# Patient Record
Sex: Female | Born: 1950 | Race: White | Hispanic: No | Marital: Single | Smoking: Never smoker
Health system: Southern US, Community
[De-identification: ages and names within clinical notes are randomized; demographics above are authoritative.]

## PROBLEM LIST (undated history)

## (undated) HISTORY — PX: ABDOMINAL HYSTERECTOMY: SHX81

---

## 2008-05-30 ENCOUNTER — Ambulatory Visit: Payer: Self-pay | Admitting: Family Medicine

## 2008-05-30 DIAGNOSIS — I10 Essential (primary) hypertension: Secondary | ICD-10-CM | POA: Insufficient documentation

## 2008-05-30 DIAGNOSIS — F1010 Alcohol abuse, uncomplicated: Secondary | ICD-10-CM | POA: Insufficient documentation

## 2008-05-30 DIAGNOSIS — K219 Gastro-esophageal reflux disease without esophagitis: Secondary | ICD-10-CM | POA: Insufficient documentation

## 2008-05-31 LAB — CONVERTED CEMR LAB
ALT: 13 units/L (ref 0–35)
AST: 19 units/L (ref 0–37)
Alkaline Phosphatase: 81 units/L (ref 39–117)
BUN: 17 mg/dL (ref 6–23)
CO2: 25 meq/L (ref 19–32)
Calcium: 10.1 mg/dL (ref 8.4–10.5)
Cholesterol: 212 mg/dL — ABNORMAL HIGH (ref 0–200)
Glucose, Bld: 107 mg/dL — ABNORMAL HIGH (ref 70–99)
HDL: 91 mg/dL (ref >39)
Potassium: 4.6 meq/L (ref 3.5–5.3)
Sodium: 136 meq/L (ref 135–145)
TSH: 1.598 microintl units/mL (ref 0.350–4.50)
Total Protein: 7.2 g/dL (ref 6.0–8.3)
Triglycerides: 109 mg/dL (ref <150)

## 2008-06-14 ENCOUNTER — Ambulatory Visit: Payer: Self-pay | Admitting: Family Medicine

## 2008-06-14 DIAGNOSIS — M79609 Pain in unspecified limb: Secondary | ICD-10-CM | POA: Insufficient documentation

## 2008-08-17 ENCOUNTER — Encounter: Payer: Self-pay | Admitting: Family Medicine

## 2008-08-18 LAB — CONVERTED CEMR LAB
Folate: 13.5 ng/mL
HCT: 40 % (ref 36.0–46.0)
Hemoglobin: 12.6 g/dL (ref 12.0–15.0)
MCV: 102.6 fL — ABNORMAL HIGH (ref 78.0–100.0)

## 2008-08-25 ENCOUNTER — Encounter: Admission: RE | Admit: 2008-08-25 | Discharge: 2008-08-25 | Payer: Self-pay | Admitting: Family Medicine

## 2008-08-26 DIAGNOSIS — IMO0002 Reserved for concepts with insufficient information to code with codable children: Secondary | ICD-10-CM | POA: Insufficient documentation

## 2008-08-26 DIAGNOSIS — M1710 Unilateral primary osteoarthritis, unspecified knee: Secondary | ICD-10-CM

## 2008-10-17 ENCOUNTER — Ambulatory Visit: Payer: Self-pay | Admitting: Family Medicine

## 2008-10-17 DIAGNOSIS — R079 Chest pain, unspecified: Secondary | ICD-10-CM | POA: Insufficient documentation

## 2008-10-18 ENCOUNTER — Encounter: Payer: Self-pay | Admitting: Family Medicine

## 2008-10-18 LAB — CONVERTED CEMR LAB
AST: 15 units/L (ref 0–37)
Alkaline Phosphatase: 84 units/L (ref 39–117)
Basophils Absolute: 0 10*3/uL (ref 0.0–0.1)
Calcium: 9 mg/dL (ref 8.4–10.5)
Creatinine, Ser: 0.86 mg/dL (ref 0.40–1.20)
Eosinophils Relative: 2 % (ref 0–5)
HCT: 38.2 % (ref 36.0–46.0)
Lymphocytes Relative: 31 % (ref 12–46)
Lymphs Abs: 1.3 10*3/uL (ref 0.7–4.0)
Neutro Abs: 2.5 10*3/uL (ref 1.7–7.7)
Platelets: 270 10*3/uL (ref 150–400)
Potassium: 5.1 meq/L (ref 3.5–5.3)
Total Bilirubin: 0.5 mg/dL (ref 0.3–1.2)
Total Protein: 6.9 g/dL (ref 6.0–8.3)
WBC: 4.3 10*3/uL (ref 4.0–10.5)

## 2010-10-15 ENCOUNTER — Telehealth (INDEPENDENT_AMBULATORY_CARE_PROVIDER_SITE_OTHER): Payer: Self-pay | Admitting: *Deleted

## 2010-11-08 NOTE — Progress Notes (Signed)
Summary: NEED MEDS SUPPLIES  Phone Note Call from Patient   Caller: Patient Summary of Call: PT CALLED ADVISE THAT SHE NOW HAS INSURANCE AND WOULD LIKE SUPPLIES OF NIXEUM 40 FOR SENT TO [facility] IN  AND SUPPLY OF MOLOXICAM. IF U NEED TO CALL HER # [phone] Initial call taken by: Janeal Holmes,  October 15, 2010 9:47 AM  Follow-up for Phone Call        Pt notified med sent and sent to schedule appt. Follow-up by: Kathlene November LPN,  October 16, 2010 10:48 AM    Prescriptions: LISINOPRIL-HYDROCHLOROTHIAZIDE 20-25 MG TABS (LISINOPRIL-HYDROCHLOROTHIAZIDE) Take 1 tablet by mouth once a day  #90 x 2   Entered by:   Kathlene November LPN   Authorized by:   Nani Gasser MD   Signed by:   Kathlene November LPN on 04/54/0981   Method used:   Electronically to        [facility] [phone]* (retail)       [address] [place], [place]  [address]       Ph: [phone]       Fax: [phone]   RxID:   [id number] NEXIUM 40 MG CPDR (ESOMEPRAZOLE MAGNESIUM) Take 1 tablet by mouth once a day  #90 x 2   Entered by:   Kathlene November LPN   Authorized by:   Nani Gasser MD   Signed by:   Kathlene November LPN on 40/34/7425   Method used:   Electronically to        [facility] [phone]* (retail)       [address] [place], [place]  [address]       Ph: [phone]       Fax: [phone]   RxID:   [id number]

## 2010-11-27 ENCOUNTER — Ambulatory Visit: Payer: Self-pay | Admitting: Family Medicine

## 2010-11-28 ENCOUNTER — Ambulatory Visit: Payer: Self-pay | Admitting: Family Medicine

## 2011-04-24 ENCOUNTER — Other Ambulatory Visit: Payer: Self-pay | Admitting: Family Medicine

## 2011-04-24 DIAGNOSIS — Z1231 Encounter for screening mammogram for malignant neoplasm of breast: Secondary | ICD-10-CM

## 2011-04-30 ENCOUNTER — Ambulatory Visit: Payer: Self-pay

## 2011-07-14 ENCOUNTER — Other Ambulatory Visit: Payer: Self-pay | Admitting: Family Medicine

## 2011-07-26 ENCOUNTER — Encounter: Payer: Self-pay | Admitting: Family Medicine

## 2011-07-30 ENCOUNTER — Ambulatory Visit: Payer: Self-pay | Admitting: Family Medicine

## 2011-08-08 ENCOUNTER — Telehealth: Payer: Self-pay | Admitting: Family Medicine

## 2011-08-08 NOTE — Telephone Encounter (Signed)
Please call patient. Normal mammogram.  Repeat in 1 year.  

## 2011-08-09 NOTE — Telephone Encounter (Signed)
Pt aware.

## 2011-08-28 ENCOUNTER — Encounter: Payer: Self-pay | Admitting: Family Medicine

## 2011-10-21 ENCOUNTER — Other Ambulatory Visit: Payer: Self-pay | Admitting: *Deleted

## 2011-10-21 ENCOUNTER — Ambulatory Visit: Payer: Self-pay | Admitting: Family Medicine

## 2011-10-21 MED ORDER — LISINOPRIL-HYDROCHLOROTHIAZIDE 20-25 MG PO TABS: 1.0000 | ORAL_TABLET | Freq: Every day | ORAL | Status: DC

## 2011-10-28 ENCOUNTER — Ambulatory Visit: Payer: Self-pay | Admitting: Family Medicine

## 2011-11-04 ENCOUNTER — Ambulatory Visit: Payer: Self-pay | Admitting: Family Medicine

## 2011-11-11 ENCOUNTER — Ambulatory Visit (INDEPENDENT_AMBULATORY_CARE_PROVIDER_SITE_OTHER): Payer: BC Managed Care – PPO | Admitting: Family Medicine

## 2011-11-11 ENCOUNTER — Encounter: Payer: Self-pay | Admitting: Family Medicine

## 2011-11-11 VITALS — BP 144/68 | HR 89 | Wt 172.0 lb

## 2011-11-11 DIAGNOSIS — R42 Dizziness and giddiness: Secondary | ICD-10-CM

## 2011-11-11 DIAGNOSIS — I10 Essential (primary) hypertension: Secondary | ICD-10-CM

## 2011-11-11 DIAGNOSIS — K219 Gastro-esophageal reflux disease without esophagitis: Secondary | ICD-10-CM

## 2011-11-11 MED ORDER — METOPROLOL SUCCINATE ER 25 MG PO TB24: 25.0000 mg | ORAL_TABLET | Freq: Every day | ORAL | Status: DC

## 2011-11-11 NOTE — Progress Notes (Signed)
  Subjective:    Patient ID: Alison [name], female    DOB: June 04, 1951, 61 y.o.   MRN: [id number]  HPI  She feels really lightheaded after eats, every time.  Says started a year ago.  She wonders if could be from her BP medication. No worsening or alleviating sxs. Last for a few mintues. No nausea or HA.  She has been very compliant with her meds. No CP or SOB. Says sometimes feels like she might throw up after she eats. Feels like she might gag and then will choke and then feels better. Happens only with spicey or greasy foods. Says food doesn't really get stuck.  She takes her nexium daily.  She is past due for colonoscopy but declines today.    Review of Systems     Objective:   Physical Exam  Constitutional: She is oriented to person, place, and time. She appears well-developed and well-nourished.  HENT:  Head: Normocephalic and atraumatic.  Cardiovascular: Normal rate, regular rhythm and normal heart sounds.   Pulmonary/Chest: Effort normal and breath sounds normal.  Neurological: She is alert and oriented to person, place, and time.  Skin: Skin is warm and dry.  Psychiatric: She has a normal mood and affect. Her behavior is normal.          Assessment & Plan:  HTN - not well controlled. Discussed low salt diet and regular exercise. We also will add metoprolol 20 mg to her current regimen. I discussed that her old BP med is working fine but there may be reasons we need to add to it. Reviewed low sat diet. F/u in 1 mo.   Dizzy after eating - unclear etiology. Discussed that it could be a her glucose or maybe even her blood pressure going too high. She has not been a long time so not sure how long her blood pressure has not been well controlled. We will check a TSH, CBC and CMP. She is also due for a screening lipid panel.  GERD- The choking/gaging seem to be reflux related.  Can use TUMS for break through sxs. Also reviewed foods needs to stay away from . Discussed GI but pt  declined to see them.   She declines flu and tetanus vaccines today.  She declines colonoscopy today.

## 2011-11-11 NOTE — Patient Instructions (Signed)

## 2011-12-17 ENCOUNTER — Other Ambulatory Visit: Payer: Self-pay | Admitting: Family Medicine

## 2012-02-18 ENCOUNTER — Other Ambulatory Visit: Payer: Self-pay | Admitting: Family Medicine

## 2012-06-14 ENCOUNTER — Other Ambulatory Visit: Payer: Self-pay | Admitting: Family Medicine

## 2012-06-24 ENCOUNTER — Telehealth: Payer: Self-pay | Admitting: *Deleted

## 2012-06-24 MED ORDER — MOMETASONE FUROATE 50 MCG/ACT NA SUSP: NASAL | Status: DC

## 2012-06-24 NOTE — Telephone Encounter (Signed)
I'd be happy to, if her symptoms persist after five days of using nasonex I'd encourage her to schedule a clinic visit for an evaluation.  I've sent in the Rx to her [facility] on file.

## 2012-06-24 NOTE — Telephone Encounter (Signed)
Pt has called asking if we can call her in some Nasonex. States allergies are bothering her and she has a stuffy head. States she has started taking allegra. Pt of Dr. Linford Arnold. Please advise.

## 2012-06-24 NOTE — Telephone Encounter (Signed)
Pt informed

## 2012-07-17 ENCOUNTER — Other Ambulatory Visit: Payer: Self-pay | Admitting: Family Medicine

## 2012-08-18 ENCOUNTER — Other Ambulatory Visit: Payer: Self-pay | Admitting: Family Medicine

## 2012-08-31 ENCOUNTER — Other Ambulatory Visit: Payer: Self-pay | Admitting: Family Medicine

## 2012-08-31 NOTE — Telephone Encounter (Signed)
Needs appointment

## 2012-09-14 ENCOUNTER — Other Ambulatory Visit: Payer: Self-pay | Admitting: Family Medicine

## 2012-09-14 NOTE — Telephone Encounter (Signed)
Must make appointment 

## 2012-10-16 ENCOUNTER — Other Ambulatory Visit: Payer: Self-pay | Admitting: Family Medicine

## 2012-11-09 ENCOUNTER — Ambulatory Visit: Payer: BC Managed Care – PPO | Admitting: Family Medicine

## 2012-11-09 ENCOUNTER — Other Ambulatory Visit: Payer: Self-pay | Admitting: *Deleted

## 2012-11-09 MED ORDER — METOPROLOL SUCCINATE ER 25 MG PO TB24: 25.0000 mg | ORAL_TABLET | Freq: Every day | ORAL | Status: DC

## 2012-11-10 NOTE — Telephone Encounter (Addendum)
Pt called to inform me that she had forgotten that she had to work and was asking me to call in a Rx for her to get her BP meds and asked if I could call in the prescription for a 30 day supply due to price. (While pt was talking I looked back at her visit history and pt chronically DNKA's. since 11/28/2011, 11/29/2011, 07/29/2012, 10/21/2011, 10/28/2011, 11/04/2011, and 11/09/2012 she has cancelled all of these appt. She has an appt on 11/16/2012 @ 1045.) She told me that the last time that this had to be done they called her in 20 of her pills and it cost her the same amount that it did had she gotten a 30 day supply. I informed her that I could not make any guarantees that she would receive a 30 day supply being that her appt was for her to come in today to discuss her BP. I told her that more than likely she would receive an amount that will be enough to last her until she came in for her appt. Pt was not very happy about the answer that was given and I told her that this is standard even with me or anyone else. She still continued to say how unfair it is that she would have to pay the same amount or close to it for a few pills when I should just call it in for a 30 day supply. I informed Dr. Linford Arnold of this and also informed her of all of the pt's cancelled visits she agreed with my plan to send in enough tablets until pt's appt.  I was not able to call in pt's rx after speaking with her due to pt's coming into office. Pt called back later in the day very irrational and irate saying that if she has a stroke it will be my fault and I will be sued. She asked if we/I cared about the well being of our patients. She continues on with this onslaught of words and she became louder and began to belittle me questioning my intelligence. I then told the pt that I would not continue to allow her to speak to me in this manner and if she continued speaking to me in this manner that I will hang up the phone. This did not deter  her she continued with her verbal assault towards me. I then hung up the phone. Pt called back several times she spoke with Victorino Dike to inquire whether or not her meds were sent.  I spoke with my Director Toniann Fail B. regarding this and informed her that I would write this up as a notable behavior in the pt's chart. I will route this to Dr. Linford Arnold and Gardiner Ramus, Viann Shove

## 2012-11-16 ENCOUNTER — Encounter: Payer: Self-pay | Admitting: Family Medicine

## 2012-11-16 ENCOUNTER — Ambulatory Visit (INDEPENDENT_AMBULATORY_CARE_PROVIDER_SITE_OTHER): Payer: BC Managed Care – PPO | Admitting: Family Medicine

## 2012-11-16 VITALS — BP 160/98 | HR 92 | Ht 61.0 in | Wt 181.0 lb

## 2012-11-16 DIAGNOSIS — Z23 Encounter for immunization: Secondary | ICD-10-CM

## 2012-11-16 DIAGNOSIS — I10 Essential (primary) hypertension: Secondary | ICD-10-CM

## 2012-11-16 MED ORDER — METOPROLOL SUCCINATE ER 25 MG PO TB24: 25.0000 mg | ORAL_TABLET | Freq: Every day | ORAL | Status: DC

## 2012-11-16 MED ORDER — ESOMEPRAZOLE MAGNESIUM 40 MG PO CPDR: DELAYED_RELEASE_CAPSULE | ORAL | Status: DC

## 2012-11-16 NOTE — Progress Notes (Signed)
  Subjective:    Patient ID: Alison [name], female    DOB: 11-10-1950, 62 y.o.   MRN: [id number]  HPI HTN -  Pt denies chest pain, SOB, dizziness, or heart palpitations.  Taking meds as directed w/o problems.  Denies medication side effects.  Trying to eat a low salt diet.  She was given a lab slip of this year ago and never went. She says she is willing to go now but does need to it re-printed. She was a little bit upset today. She got upset with her nurse on the phone last week and a little embarrassed and then felt very anxious about coming in today. In fact she was tearful during the visit today.    Review of Systems     Objective:   Physical Exam  Constitutional: She is oriented to person, place, and time. She appears well-developed and well-nourished.  HENT:  Head: Normocephalic and atraumatic.  Cardiovascular: Normal rate, regular rhythm and normal heart sounds.   Pulmonary/Chest: Effort normal and breath sounds normal.  Neurological: She is alert and oriented to person, place, and time.  Skin: Skin is warm and dry.  Psychiatric: She has a normal mood and affect. Her behavior is normal.          Assessment & Plan:  HTN- uncontrolled.  She was upset today and feels like that to be the cause of her elevated blood pressure. Recommend followup in one month to repeat pressure, before we adjust her medication or increase it. Continue work on low-salt diet. Also due for CMP and fasting lipid panel. Lab slip given today. Medication refill today for 2 months worth.  Discussed need for shingles vaccine. Handout given. Encouraged her to check with her insurance to see if it's covered.  Due for screening mammogram. Reminded her. I can encourage her to check with her insurance to verify coverage.

## 2012-11-16 NOTE — Patient Instructions (Addendum)
Check with insurance to see if covers a zostavax.   Blood pressure cuff brands:  Omron and lifestyle. DASH Diet The DASH diet stands for "Dietary Approaches to Stop Hypertension." It is a healthy eating plan that has been shown to reduce high blood pressure (hypertension) in as little as 14 days, while also possibly providing other significant health benefits. These other health benefits include reducing the risk of breast cancer after menopause and reducing the risk of type 2 diabetes, heart disease, colon cancer, and stroke. Health benefits also include weight loss and slowing kidney failure in patients with chronic kidney disease.  DIET GUIDELINES  Limit salt (sodium). Your diet should contain less than 1500 mg of sodium daily.  Limit refined or processed carbohydrates. Your diet should include mostly whole grains. Desserts and added sugars should be used sparingly.  Include small amounts of heart-healthy fats. These types of fats include nuts, oils, and tub margarine. Limit saturated and trans fats. These fats have been shown to be harmful in the body. CHOOSING FOODS  The following food groups are based on a 2000 calorie diet. See your Registered Dietitian for individual calorie needs. Grains and Grain Products (6 to 8 servings daily)  Eat More Often: Whole-wheat bread, brown rice, whole-grain or wheat pasta, quinoa, popcorn without added fat or salt (air popped).  Eat Less Often: White bread, white pasta, white rice, cornbread. Vegetables (4 to 5 servings daily)  Eat More Often: Fresh, frozen, and canned vegetables. Vegetables may be raw, steamed, roasted, or grilled with a minimal amount of fat.  Eat Less Often/Avoid: Creamed or fried vegetables. Vegetables in a cheese sauce. Fruit (4 to 5 servings daily)  Eat More Often: All fresh, canned (in natural juice), or frozen fruits. Dried fruits without added sugar. One hundred percent fruit juice ( cup [237 mL] daily).  Eat Less Often:  Dried fruits with added sugar. Canned fruit in light or heavy syrup. [employer], Fish, and Poultry (2 servings or less daily. One serving is 3 to 4 oz [85-114 g]).  Eat More Often: Ninety percent or leaner ground beef, tenderloin, sirloin. Round cuts of beef, chicken breast, [place] breast. All fish. Grill, bake, or broil your meat. Nothing should be fried.  Eat Less Often/Avoid: Fatty cuts of meat, [place], or chicken leg, thigh, or wing. Fried cuts of meat or fish. Dairy (2 to 3 servings)  Eat More Often: Low-fat or fat-free milk, low-fat plain or light yogurt, reduced-fat or part-skim cheese.  Eat Less Often/Avoid: Milk (whole, 2%).Whole milk yogurt. Full-fat cheeses. Nuts, Seeds, and Legumes (4 to 5 servings per week)  Eat More Often: All without added salt.  Eat Less Often/Avoid: Salted nuts and seeds, canned beans with added salt. Fats and Sweets (limited)  Eat More Often: Vegetable oils, tub margarines without trans fats, sugar-free gelatin. Mayonnaise and salad dressings.  Eat Less Often/Avoid: Coconut oils, palm oils, butter, stick margarine, cream, half and half, cookies, candy, pie. FOR MORE INFORMATION The Dash Diet Eating Plan: www.dashdiet.org Document Released: 09/12/2011 Document Revised: 12/16/2011 Document Reviewed: 09/12/2011 [facility] Patient Information 2013 [place], [place].

## 2012-12-14 ENCOUNTER — Ambulatory Visit: Payer: BC Managed Care – PPO | Admitting: Family Medicine

## 2012-12-21 ENCOUNTER — Ambulatory Visit: Payer: BC Managed Care – PPO | Admitting: Family Medicine

## 2012-12-22 ENCOUNTER — Ambulatory Visit: Payer: BC Managed Care – PPO | Admitting: Family Medicine

## 2013-02-02 LAB — LIPID PANEL
Cholesterol: 212 mg/dL — ABNORMAL HIGH (ref 0–200)
LDL Cholesterol: 111 mg/dL — ABNORMAL HIGH (ref 0–99)
Total CHOL/HDL Ratio: 2.7 Ratio

## 2013-02-02 LAB — COMPLETE METABOLIC PANEL WITH GFR
ALT: 13 U/L (ref 0–35)
AST: 14 U/L (ref 0–37)
Alkaline Phosphatase: 91 U/L (ref 39–117)
BUN: 17 mg/dL (ref 6–23)
CO2: 26 mEq/L (ref 19–32)
Creat: 0.81 mg/dL (ref 0.50–1.10)
GFR, Est African American: >89 mL/min
GFR, Est Non African American: 79 mL/min
Glucose, Bld: 105 mg/dL — ABNORMAL HIGH (ref 70–99)
Sodium: 139 mEq/L (ref 135–145)
Total Bilirubin: 0.9 mg/dL (ref 0.3–1.2)

## 2013-02-02 LAB — CBC WITH DIFFERENTIAL/PLATELET
HCT: 40.5 % (ref 36.0–46.0)
Lymphocytes Relative: 39 % (ref 12–46)
Lymphs Abs: 1.4 10*3/uL (ref 0.7–4.0)
MCH: 33.5 pg (ref 26.0–34.0)
MCV: 99 fL (ref 78.0–100.0)
Monocytes Absolute: 0.2 10*3/uL (ref 0.1–1.0)
Platelets: 221 10*3/uL (ref 150–400)
RBC: 4.09 MIL/uL (ref 3.87–5.11)
RDW: 13.2 % (ref 11.5–15.5)
WBC: 3.5 10*3/uL — ABNORMAL LOW (ref 4.0–10.5)

## 2013-02-02 LAB — TSH: TSH: 2.191 u[IU]/mL (ref 0.350–4.500)

## 2013-02-03 ENCOUNTER — Other Ambulatory Visit: Payer: Self-pay | Admitting: *Deleted

## 2013-02-03 MED ORDER — METOPROLOL SUCCINATE ER 25 MG PO TB24: 25.0000 mg | ORAL_TABLET | Freq: Every day | ORAL | Status: DC

## 2013-05-05 ENCOUNTER — Other Ambulatory Visit: Payer: Self-pay | Admitting: *Deleted

## 2013-05-05 MED ORDER — METOPROLOL SUCCINATE ER 25 MG PO TB24: 25.0000 mg | ORAL_TABLET | Freq: Every day | ORAL | Status: DC

## 2013-05-31 ENCOUNTER — Ambulatory Visit: Payer: BC Managed Care – PPO | Admitting: Family Medicine

## 2013-08-02 ENCOUNTER — Ambulatory Visit: Payer: BC Managed Care – PPO | Admitting: Family Medicine

## 2013-08-09 ENCOUNTER — Other Ambulatory Visit: Payer: Self-pay | Admitting: *Deleted

## 2013-08-09 ENCOUNTER — Telehealth: Payer: Self-pay | Admitting: *Deleted

## 2013-08-09 ENCOUNTER — Other Ambulatory Visit: Payer: Self-pay | Admitting: Family Medicine

## 2013-08-09 MED ORDER — METOPROLOL SUCCINATE ER 25 MG PO TB24: 25.0000 mg | ORAL_TABLET | Freq: Every day | ORAL | Status: DC

## 2013-08-09 NOTE — Telephone Encounter (Signed)
Pt called back after I LM stating we would only do the 2 weeks stating how the pharmacy charges more for 2 weeks than a month. I informed her how she was suppose to f/u in April and has not. She gave reasons why her BP was elevated and I informed her that insurance companies do send you questionaires and if pt have not f/u then they won't cover medical issues and it is a liability. She stated she would make her appt but she would not be referring to you like she has been. I informed her that was ok but it is a liability if we don't make them f/u.  Meyer Cory, LPN   [address]

## 2013-08-09 NOTE — Telephone Encounter (Signed)
Pt called and LM stating she needs to cancel her appt in am and wants a refill on her BP med. She spoke as if [place] had already sent over 1 refill until her appt. She has not been seen since Feb 2014. Please advise about a refill.  Meyer Cory, LPN

## 2013-08-09 NOTE — Telephone Encounter (Signed)
LMOM and informed pt I was sending 2 weeks worth and she needs to comer in before they run out.  Meyer Cory, LPN

## 2013-08-09 NOTE — Telephone Encounter (Signed)
Can have 2 weeks worth

## 2013-08-10 ENCOUNTER — Ambulatory Visit: Payer: BC Managed Care – PPO | Admitting: Family Medicine

## 2013-08-16 ENCOUNTER — Encounter: Payer: Self-pay | Admitting: Family Medicine

## 2013-08-16 ENCOUNTER — Ambulatory Visit (INDEPENDENT_AMBULATORY_CARE_PROVIDER_SITE_OTHER): Payer: BC Managed Care – PPO | Admitting: Family Medicine

## 2013-08-16 VITALS — BP 150/79 | HR 83 | Wt 174.0 lb

## 2013-08-16 DIAGNOSIS — I10 Essential (primary) hypertension: Secondary | ICD-10-CM

## 2013-08-16 MED ORDER — METOPROLOL SUCCINATE ER 50 MG PO TB24: 50.0000 mg | ORAL_TABLET | Freq: Every day | ORAL | Status: DC

## 2013-08-16 NOTE — Progress Notes (Signed)
  Subjective:    Patient ID: Genevive Bi, female    DOB: [birth year], 62 y.o.   MRN: [id number]  HPI HTN- Home BP running in the 150s. No chest pain, shortest breath, palpitations or swelling. She's tolerating her medication well. She says she's been resistant to coming in because she really doesn't like coming to the doctor's office.  She seperated from he husband in June and is doing ok. She's not taking medications or any counseling for this. She says mentally she is adjusting and feels she is doing okay overall.  Review of Systems     Objective:   Physical Exam  Constitutional: She is oriented to person, place, and time. She appears well-developed and well-nourished.  HENT:  Head: Normocephalic and atraumatic.  Neck: Neck supple. No thyromegaly present.  Cardiovascular: Normal rate, regular rhythm and normal heart sounds.   No carotid or abdominal bruits  Pulmonary/Chest: Effort normal and breath sounds normal.  Musculoskeletal: She exhibits no edema.  Neurological: She is alert and oriented to person, place, and time.  Skin: Skin is warm and dry.  Psychiatric: She has a normal mood and affect. Her behavior is normal.          Assessment & Plan:  HTN- uncontrolled on current regimen. We'll increase metoprolol to 50 mg. Offered to switch her to twice a day dosing to metoprolol tartrate for cheaper option but she declines as she goes to quit once a day. Also encouraged her to check with her insurance company or the pharmacy to see if a 90 day supply will be less expensive. Otherwise followup in 3 months to recheck blood pressure on adjusted regimen if she has any side effects or problems and was called office back and we can make an adjustment of the phone. She is also interested in potentially switching back to lisinopril instead because it was less expensive, which is perfect we find that she will need followup in 6 weeks. She declined.  Declined flu vaccine and tetanus today.  Encouraged her to think about it.

## 2013-11-16 ENCOUNTER — Other Ambulatory Visit: Payer: Self-pay | Admitting: Family Medicine

## 2013-11-22 ENCOUNTER — Ambulatory Visit: Payer: BC Managed Care – PPO | Admitting: Physician Assistant

## 2013-11-22 DIAGNOSIS — Z0289 Encounter for other administrative examinations: Secondary | ICD-10-CM

## 2013-11-26 ENCOUNTER — Encounter: Payer: Self-pay | Admitting: Physician Assistant

## 2013-11-26 ENCOUNTER — Ambulatory Visit (INDEPENDENT_AMBULATORY_CARE_PROVIDER_SITE_OTHER): Payer: BC Managed Care – PPO | Admitting: Physician Assistant

## 2013-11-26 VITALS — BP 202/85 | HR 83 | Wt 174.0 lb

## 2013-11-26 DIAGNOSIS — F1010 Alcohol abuse, uncomplicated: Secondary | ICD-10-CM

## 2013-11-26 DIAGNOSIS — F329 Major depressive disorder, single episode, unspecified: Secondary | ICD-10-CM

## 2013-11-26 DIAGNOSIS — I10 Essential (primary) hypertension: Secondary | ICD-10-CM

## 2013-11-26 DIAGNOSIS — F32A Depression, unspecified: Secondary | ICD-10-CM

## 2013-11-26 DIAGNOSIS — F3289 Other specified depressive episodes: Secondary | ICD-10-CM

## 2013-11-26 MED ORDER — METOPROLOL SUCCINATE ER 100 MG PO TB24: 100.0000 mg | ORAL_TABLET | Freq: Every day | ORAL | Status: DC

## 2013-11-26 MED ORDER — SERTRALINE HCL 50 MG PO TABS: ORAL_TABLET | ORAL | Status: DC

## 2013-11-26 MED ORDER — NALTREXONE HCL 50 MG PO TABS: 50.0000 mg | ORAL_TABLET | Freq: Every day | ORAL | Status: DC

## 2013-11-26 NOTE — Progress Notes (Signed)
   Subjective:    Patient ID: Alison [name], female    DOB: 02-04-1951, 63 y.o.   MRN: [id number]  HPI Patient is a 63 year old female who presents to the clinic with multiple concerns. She is very upset today. She is commonly seeking help for her alcoholism. Last seen her husband left her of 33 years. He left her because she was wanting him to become sober. Patient does not want to drink anymore. She finds herself drinking to treat her pain. She continues to work but when she comes home she will get completely drunk. She denies any suicidal thoughts. She's tried many times to stop drinking and lasted approximately a month. She goes to [employer] meetings and they do help. She cries all the time. She is ready to move all of her life. Patient has not had anything to drink for last 48 hours.  Hypertension-patient denies any chest pain, palpitations, vision changes, or headaches. She is taking her metoprolol as directed.     Review of Systems     Objective:   Physical Exam  Constitutional: She is oriented to person, place, and time. She appears well-developed and well-nourished.  HENT:  Head: Normocephalic and atraumatic.  Cardiovascular: Normal rate, regular rhythm and normal heart sounds.   Pulmonary/Chest: Effort normal and breath sounds normal.  Neurological: She is alert and oriented to person, place, and time.  Skin: Skin is dry.  Psychiatric:  Cried for most of the encounter.          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 I would like to start Zoloft 50 mg daily for depression. Discussed side effects of nausea, decreased libido, worsening depression. If she has any appreciable stop medication call office. Patient has trouble with on and off drinking. I would like to start Naltrexone 50 mg daily. I am also going to refer her to a psychologist to help her with counseling. I encouraged regular attendance to AA meetings and staying busy. I encouraged her to set up a  plan for cravings or being alone. If patient needs anything for sleep she was instructed to try melatonin 3mg  to 10mg  at bedtime. Follow up in 4 weeks.  Hypertension-I. suspect there is some type of correlation with depression/alcohol abuse and blood pressure. Will increase metoprolol to 100 mg daily. Will recheck in the next 2-4 weeks. Patient is to check her BP at home and call if continued to stay elevated.   Spent 30 minutes with patient and greater then 50% of visit spent counseling patient regarding alcoholism/depression.

## 2013-11-26 NOTE — Patient Instructions (Signed)
Start naltrexone daily. Zoloft take 1/2 tab for 7 days then increase to one full tablet.  Increase metoprolol 100mg  daily.   Follow up on 4 weeks.

## 2013-11-29 DIAGNOSIS — I10 Essential (primary) hypertension: Secondary | ICD-10-CM | POA: Insufficient documentation

## 2013-11-29 DIAGNOSIS — F32A Depression, unspecified: Secondary | ICD-10-CM | POA: Insufficient documentation

## 2013-11-29 DIAGNOSIS — F1010 Alcohol abuse, uncomplicated: Secondary | ICD-10-CM | POA: Insufficient documentation

## 2013-11-29 DIAGNOSIS — F329 Major depressive disorder, single episode, unspecified: Secondary | ICD-10-CM | POA: Insufficient documentation

## 2013-12-20 ENCOUNTER — Ambulatory Visit: Payer: BC Managed Care – PPO | Admitting: Physician Assistant

## 2013-12-31 ENCOUNTER — Other Ambulatory Visit: Payer: Self-pay | Admitting: Physician Assistant

## 2014-01-28 ENCOUNTER — Other Ambulatory Visit: Payer: Self-pay | Admitting: Physician Assistant

## 2014-05-18 ENCOUNTER — Other Ambulatory Visit: Payer: Self-pay | Admitting: Family Medicine

## 2014-06-22 ENCOUNTER — Other Ambulatory Visit: Payer: Self-pay | Admitting: Physician Assistant

## 2014-07-22 ENCOUNTER — Other Ambulatory Visit: Payer: Self-pay | Admitting: Physician Assistant

## 2014-08-15 ENCOUNTER — Other Ambulatory Visit: Payer: Self-pay | Admitting: Family Medicine

## 2014-08-23 ENCOUNTER — Other Ambulatory Visit: Payer: Self-pay | Admitting: *Deleted

## 2014-08-23 MED ORDER — METOPROLOL SUCCINATE ER 100 MG PO TB24: ORAL_TABLET | ORAL | Status: DC

## 2014-08-23 NOTE — Telephone Encounter (Signed)
30 day supply sent to pharmacy and follow up appt is 09/07/14. Corliss SkainsJamie Nikolos Billig, RMA

## 2014-09-06 ENCOUNTER — Ambulatory Visit: Payer: BC Managed Care – PPO | Admitting: Family Medicine

## 2014-09-12 ENCOUNTER — Ambulatory Visit (INDEPENDENT_AMBULATORY_CARE_PROVIDER_SITE_OTHER): Payer: BC Managed Care – PPO | Admitting: Family Medicine

## 2014-09-12 ENCOUNTER — Encounter: Payer: Self-pay | Admitting: Family Medicine

## 2014-09-12 VITALS — BP 154/72 | HR 65 | Temp 97.8°F | Ht 61.0 in | Wt 178.0 lb

## 2014-09-12 DIAGNOSIS — I10 Essential (primary) hypertension: Secondary | ICD-10-CM | POA: Diagnosis not present

## 2014-09-12 DIAGNOSIS — F329 Major depressive disorder, single episode, unspecified: Secondary | ICD-10-CM | POA: Diagnosis not present

## 2014-09-12 DIAGNOSIS — F32A Depression, unspecified: Secondary | ICD-10-CM

## 2014-09-12 MED ORDER — LISINOPRIL-HYDROCHLOROTHIAZIDE 20-25 MG PO TABS: 1.0000 | ORAL_TABLET | Freq: Every day | ORAL | Status: DC

## 2014-09-12 NOTE — Progress Notes (Signed)
   Subjective:    Patient ID: Genevive BiBetty A Rue, female    DOB: 1951-07-15, 63 y.o.   MRN: [id number]  HPI Hypertension- Pt denies chest pain, SOB, dizziness, or heart palpitations.  Taking meds as directed w/o problems.  Denies medication side effects.  Metoprolol was increased to 100 mg couple weeks ago. Her blood pressure systolic was 202 at that time. She plans on joining the gym in Jan.   Depression/alcohol abuse-She was seen in Feb after her husband of 33 yr left here. She decided not to take the naltrexone as took it once and it caused vomiting. She decided not to take the Zoloft. Both of these will be discontinued and taken off her medication list. She says she is no longer drinking. She went to AA for several months and has been abstinent. Unfortunately this caused her insurance to go up.     Review of Systems     Objective:   Physical Exam  Constitutional: She is oriented to person, place, and time. She appears well-developed and well-nourished.  HENT:  Head: Normocephalic and atraumatic.  Cardiovascular: Normal rate, regular rhythm and normal heart sounds.   Pulmonary/Chest: Effort normal and breath sounds normal.  Neurological: She is alert and oriented to person, place, and time.  Skin: Skin is warm and dry.  Psychiatric: She has a normal mood and affect. Her behavior is normal.          Assessment & Plan:  Hypertension-uncontrolled but improved from previous. Will add back lisinopril/HCTZ once a day.  Can start with half a day and increase to whole tab after 2 weeks.  F/U in 1 months.  We'll check a BMP at that time since we are going to start her back on an ACE inhibitor and a diuretic.  Depression/alcohol abuse - has been abstinent and doing well. Removed meds from her list.

## 2014-10-03 ENCOUNTER — Other Ambulatory Visit: Payer: Self-pay | Admitting: Family Medicine

## 2014-10-10 ENCOUNTER — Ambulatory Visit: Payer: BC Managed Care – PPO | Admitting: Family Medicine

## 2014-11-01 ENCOUNTER — Other Ambulatory Visit: Payer: Self-pay | Admitting: Family Medicine

## 2014-11-02 ENCOUNTER — Other Ambulatory Visit: Payer: Self-pay | Admitting: *Deleted

## 2014-11-02 MED ORDER — METOPROLOL SUCCINATE ER 100 MG PO TB24: ORAL_TABLET | ORAL | Status: DC

## 2014-11-02 NOTE — Telephone Encounter (Signed)
Pt was told that she must keep her appt she would not be given any more refills.Alison PacasBarkley, Alison [name]

## 2014-11-21 ENCOUNTER — Ambulatory Visit: Payer: BC Managed Care – PPO | Admitting: Family Medicine

## 2014-12-05 ENCOUNTER — Encounter: Payer: Self-pay | Admitting: Family Medicine

## 2014-12-05 ENCOUNTER — Ambulatory Visit (INDEPENDENT_AMBULATORY_CARE_PROVIDER_SITE_OTHER): Payer: Self-pay | Admitting: Family Medicine

## 2014-12-05 VITALS — BP 153/65 | HR 73 | Wt 178.0 lb

## 2014-12-05 DIAGNOSIS — F1011 Alcohol abuse, in remission: Secondary | ICD-10-CM

## 2014-12-05 DIAGNOSIS — I10 Essential (primary) hypertension: Secondary | ICD-10-CM

## 2014-12-05 DIAGNOSIS — F1010 Alcohol abuse, uncomplicated: Secondary | ICD-10-CM

## 2014-12-05 MED ORDER — METOPROLOL SUCCINATE ER 100 MG PO TB24: 100.0000 mg | ORAL_TABLET | Freq: Every day | ORAL | Status: DC

## 2014-12-05 NOTE — Progress Notes (Signed)
   Subjective:    Patient ID: Alison [name], female    DOB: 12-11-1950, 64 y.o.   MRN: [id number]  HPI Hypertension- Pt denies chest pain, SOB, dizziness, or heart palpitations.  Taking meds as directed w/o problems.  Denies medication side effects.  Has gained some weight.   Alcohol abuse - she is doing well. She is still sober. She is now seperated from her husband and done well.  She really hasn't gotten out out much   Review of Systems     Objective:   Physical Exam  Constitutional: She is oriented to person, place, and time. She appears well-developed and well-nourished.  HENT:  Head: Normocephalic and atraumatic.  Cardiovascular: Normal rate, regular rhythm and normal heart sounds.   Pulmonary/Chest: Effort normal and breath sounds normal.  Neurological: She is alert and oriented to person, place, and time.  Skin: Skin is warm and dry.  Psychiatric: She has a normal mood and affect. Her behavior is normal.          Assessment & Plan:  HTN - uncontrolled but not on the ACE. She has been forgetting her dose. Discussed strategies to help her be consitant. Will check labs as she is overdue.  F/u in 6 weeks to recheck BP.    Alcohol abuse - sober and doing well for the last years.

## 2015-01-23 ENCOUNTER — Ambulatory Visit: Payer: BLUE CROSS/BLUE SHIELD | Admitting: Family Medicine

## 2015-02-27 ENCOUNTER — Ambulatory Visit (INDEPENDENT_AMBULATORY_CARE_PROVIDER_SITE_OTHER): Payer: BLUE CROSS/BLUE SHIELD | Admitting: Family Medicine

## 2015-02-27 ENCOUNTER — Encounter: Payer: Self-pay | Admitting: Family Medicine

## 2015-02-27 VITALS — BP 168/84 | HR 75 | Wt 166.0 lb

## 2015-02-27 DIAGNOSIS — I10 Essential (primary) hypertension: Secondary | ICD-10-CM

## 2015-02-27 DIAGNOSIS — T50905A Adverse effect of unspecified drugs, medicaments and biological substances, initial encounter: Secondary | ICD-10-CM

## 2015-02-27 NOTE — Progress Notes (Signed)
   Subjective:    Patient ID: Alison [name], female    DOB: February 02, 1951, 64 y.o.   MRN: [id number]  HPI Here for hypertension follow-up today. When I last saw her in February she had been forgetting her medication frequently. She says she total sample HCTZ for 3 weeks and it made her sick and dizzy. She stopped it on her own.  She is still taking her metoprolol.  Has lost 12 lbs.  Has been taking her BP around 3PM for the last 5 days and says her BP running int the 130s. She feels extremely tense when she comes in here. She really feels like she may have white coat hypertension.    Review of Systems     Objective:   Physical Exam  Constitutional: She is oriented to person, place, and time. She appears well-developed and well-nourished.  HENT:  Head: Normocephalic and atraumatic.  Cardiovascular: Normal rate, regular rhythm and normal heart sounds.   Pulmonary/Chest: Effort normal and breath sounds normal.  Neurological: She is alert and oriented to person, place, and time.  Skin: Skin is warm and dry.  Psychiatric: She has a normal mood and affect. Her behavior is normal.          Assessment & Plan:  Hypertension-uncontrolled. Congratulated her on weight loss.  She's lost 12 pounds which is absolutely fantastic. Encouraged her to continue to work on this. The blood pressures that she checked at [facility] in the last week all look great except for one which was 140. She certainly may have an element of whitecoat hypertension. I encouraged her check her blood pressure at home over the next 2 weeks at least once a day. Bring in the log with her and we will check her cuff against ours here in the office in about 2 weeks. If blood pressures are still elevated at home that we may need to consider adding something like lisinopril but much lower dose around 10 mg without the diuretic and see if she tolerates that better. Since, the lisinopril HCT 20/25 caused her to feel dizzy and  lightheaded.  Medication side effect-see note above.

## 2015-03-02 ENCOUNTER — Other Ambulatory Visit: Payer: Self-pay | Admitting: Family Medicine

## 2015-05-29 ENCOUNTER — Other Ambulatory Visit: Payer: Self-pay | Admitting: Family Medicine

## 2015-08-28 ENCOUNTER — Other Ambulatory Visit: Payer: Self-pay | Admitting: Family Medicine

## 2015-11-22 ENCOUNTER — Telehealth: Payer: Self-pay | Admitting: Family Medicine

## 2015-11-22 ENCOUNTER — Other Ambulatory Visit: Payer: Self-pay | Admitting: Family Medicine

## 2015-11-22 NOTE — Telephone Encounter (Signed)
I called pt and she states that she doesn't have any days off and she is dealing with her father dying. She will have to call back to schedule appt time. I let her talk to a nurse about her med refills

## 2015-12-06 MED ORDER — METOPROLOL SUCCINATE ER 100 MG PO TB24: 100.0000 mg | ORAL_TABLET | Freq: Every day | ORAL | Status: DC

## 2015-12-06 NOTE — Addendum Note (Signed)
Addended by: Deno Etienne on: 12/06/2015 03:37 PM   Modules accepted: Orders

## 2015-12-06 NOTE — Telephone Encounter (Signed)
1 refill given.Alison [name]

## 2016-01-23 ENCOUNTER — Other Ambulatory Visit: Payer: Self-pay | Admitting: Family Medicine

## 2016-01-31 ENCOUNTER — Ambulatory Visit (INDEPENDENT_AMBULATORY_CARE_PROVIDER_SITE_OTHER): Payer: BLUE CROSS/BLUE SHIELD | Admitting: Family Medicine

## 2016-01-31 VITALS — BP 167/63 | HR 63 | Wt 175.0 lb

## 2016-01-31 DIAGNOSIS — R51 Headache: Secondary | ICD-10-CM | POA: Diagnosis not present

## 2016-01-31 DIAGNOSIS — I10 Essential (primary) hypertension: Secondary | ICD-10-CM

## 2016-01-31 DIAGNOSIS — S40022A Contusion of left upper arm, initial encounter: Secondary | ICD-10-CM

## 2016-01-31 DIAGNOSIS — R42 Dizziness and giddiness: Secondary | ICD-10-CM | POA: Diagnosis not present

## 2016-01-31 DIAGNOSIS — R519 Headache, unspecified: Secondary | ICD-10-CM

## 2016-01-31 LAB — COMPLETE METABOLIC PANEL WITH GFR
ALBUMIN: 3.6 g/dL (ref 3.6–5.1)
ALK PHOS: 76 U/L (ref 33–130)
ALT: 10 U/L (ref 6–29)
AST: 15 U/L (ref 10–35)
BILIRUBIN TOTAL: 0.8 mg/dL (ref 0.2–1.2)
BUN: 14 mg/dL (ref 7–25)
CALCIUM: 8.9 mg/dL (ref 8.6–10.4)
CO2: 26 mmol/L (ref 20–31)
CREATININE: 0.76 mg/dL (ref 0.50–0.99)
Chloride: 103 mmol/L (ref 98–110)
GFR, Est African American: >89 mL/min (ref >=60)
GFR, Est Non African American: 83 mL/min (ref >=60)
GLUCOSE: 120 mg/dL — AB (ref 65–99)
POTASSIUM: 4.7 mmol/L (ref 3.5–5.3)
SODIUM: 139 mmol/L (ref 135–146)
TOTAL PROTEIN: 6.2 g/dL (ref 6.1–8.1)

## 2016-01-31 LAB — LIPID PANEL
CHOL/HDL RATIO: 1.9 ratio (ref <=5.0)
CHOLESTEROL: 217 mg/dL — AB (ref 125–200)
HDL: 116 mg/dL (ref >=46)
LDL Cholesterol: 88 mg/dL (ref <130)
Triglycerides: 63 mg/dL (ref <150)
VLDL: 13 mg/dL (ref <30)

## 2016-01-31 LAB — TSH: TSH: 2.98 m[IU]/L

## 2016-01-31 MED ORDER — METOPROLOL SUCCINATE ER 100 MG PO TB24: 100.0000 mg | ORAL_TABLET | Freq: Every day | ORAL | Status: DC

## 2016-01-31 MED ORDER — LOSARTAN POTASSIUM-HCTZ 100-25 MG PO TABS: 0.5000 | ORAL_TABLET | Freq: Every day | ORAL | Status: DC

## 2016-01-31 NOTE — Patient Instructions (Signed)
Start with half a tab the losartan HCT. And then when I see you back in 2 weeks we can see if you're ready to go to a whole tab or not.

## 2016-01-31 NOTE — Progress Notes (Signed)
Subjective:    Patient ID: Alison [name], female    DOB: [birth year], 65 y.o.   MRN: [id number]  HPI Hypertension- Pt denies chest pain, SOB, dizziness, or heart palpitations.  Taking meds as directed w/o problems.  Denies medication side effects.    Says that she fell Sunday at church and hit her right upper arm and is quite bruised. She said she just scuffed her foot on the gym floor and tripped. She did not feel lightheaded or dizzy at that time. They checked her blood pressure at church after she fell and her systolic was over 200.   She reports that she has felt lightheaded and dizzyOn on and off. This was unrelated to the fall at church.. She says been going on for months and seems to come and goes. It seems to get a little worse when she starts to worry. Her father did pass away recently. She says sometimes she'll get headaches on the top of her head as well. She is asymptomatic today.     Review of Systems  BP 167/63 mmHg  Pulse 63  Wt 175 lb (79.379 kg)  SpO2 99%    No Known Allergies  Past Medical History  Diagnosis Date  . Endometriosis     Past Surgical History  Procedure Laterality Date  . Abdominal hysterectomy      Social History   Social History  . Marital Status: Married    Spouse Name: N/A  . Number of Children: N/A  . Years of Education: N/A   Occupational History  . Not on file.   Social History Main Topics  . Smoking status: Never Smoker   . Smokeless tobacco: Not on file  . Alcohol Use: Yes  . Drug Use: No  . Sexual Activity: Not on file     Comment: works FT, doesn't regularly exercise   Other Topics Concern  . Not on file   Social History Narrative    Family History  Problem Relation Age of Onset  . Cancer Mother   . Hyperlipidemia Mother   . Hypertension Mother   . Alcohol abuse Father   . Cancer    . Heart attack    . Diabetes    . Stroke      Outpatient Encounter Prescriptions as of 01/31/2016  Medication Sig  .  losartan-hydrochlorothiazide (HYZAAR) 100-25 MG tablet Take 0.5-1 tablets by mouth daily.  . metoprolol succinate (TOPROL-XL) 100 MG 24 hr tablet Take 1 tablet (100 mg total) by mouth daily.  . [DISCONTINUED] metoprolol succinate (TOPROL-XL) 100 MG 24 hr tablet Take 1 tablet (100 mg total) by mouth daily. *ADDITIONAL REFILL REQUIRE AN OFFICE VISIT. PLEASE CALL THE OFFICE TO SCHEDULE*   No facility-administered encounter medications on file as of 01/31/2016.          Objective:   Physical Exam  Constitutional: She is oriented to person, place, and time. She appears well-developed and well-nourished.  HENT:  Head: Normocephalic and atraumatic.  Neck: Neck supple. No thyromegaly present.  Cardiovascular: Normal rate, regular rhythm and normal heart sounds.   Pulmonary/Chest: Effort normal and breath sounds normal.  Musculoskeletal: She exhibits no edema.  Lymphadenopathy:    She has no cervical adenopathy.  Neurological: She is alert and oriented to person, place, and time.  Skin: Skin is warm and dry.  He does have a very large bruise over the left upper arm extending over the left elbow. Normal range of motion of the elbow and shoulder.  Psychiatric: She has a normal mood and affect. Her behavior is normal.       Assessment & Plan:  HTN - Uncontrolled. We'll continue metoprolol and add losartan HCTZ to her regimen. Follow-up in 2 weeks. Start with half a tab of the losartan as she is worried about her sensitivity to medication. Due for CMP and lipid panel today. Will also check TSH. Check BMP at follow-up in 2 weeks after starting losartan HCT.  Contusion of left upper arm-appears to be healing well.  Headaches/dizziness-suspect related to her high blood pressure. F/U in 2 weeks. Will also do labs just rule out electrolyte disturbance. We'll also check her thyroid. If not improving then consider further workup with CBC.

## 2016-02-19 ENCOUNTER — Ambulatory Visit: Payer: BLUE CROSS/BLUE SHIELD | Admitting: Family Medicine

## 2016-03-06 ENCOUNTER — Ambulatory Visit (INDEPENDENT_AMBULATORY_CARE_PROVIDER_SITE_OTHER): Payer: BLUE CROSS/BLUE SHIELD | Admitting: Family Medicine

## 2016-03-06 ENCOUNTER — Encounter: Payer: Self-pay | Admitting: Family Medicine

## 2016-03-06 VITALS — BP 122/68 | HR 68 | Wt 173.0 lb

## 2016-03-06 DIAGNOSIS — F1010 Alcohol abuse, uncomplicated: Secondary | ICD-10-CM | POA: Diagnosis not present

## 2016-03-06 DIAGNOSIS — Z1231 Encounter for screening mammogram for malignant neoplasm of breast: Secondary | ICD-10-CM

## 2016-03-06 DIAGNOSIS — Z6832 Body mass index (BMI) 32.0-32.9, adult: Secondary | ICD-10-CM

## 2016-03-06 DIAGNOSIS — I10 Essential (primary) hypertension: Secondary | ICD-10-CM

## 2016-03-06 MED ORDER — LOSARTAN POTASSIUM-HCTZ 100-25 MG PO TABS: 1.0000 | ORAL_TABLET | Freq: Every day | ORAL | Status: DC

## 2016-03-06 NOTE — Progress Notes (Signed)
Subjective:    CC: HTN   HPI:  Hypertension- Pt denies chest pain, SOB, dizziness, or heart palpitations.  Taking meds as directed w/o problems.  Denies medication side effects.  She try going up to 2 tabs on the losartan and says it gave her horrible headache for about a week so she went back down to one half tab and has been tolerating that well. She's on the metoprolol 100 mg daily. She's decided she really wants to commit to losing weight to better control her blood pressure.  Alcohol abuse - Still sober and not drinking.   She admits she hasn't really been exercising due to her knee pain. She was told years ago that she really needed knee replacements. She has gained some weight over the last year or so and does know that it is affecting her blood pressure.    Past medical history, Surgical history, Family history not pertinant except as noted below, Social history, Allergies, and medications have been entered into the medical record, reviewed, and corrections made.   Review of Systems: No fevers, chills, night sweats, weight loss, chest pain, or shortness of breath.   Objective:    General: Well Developed, well nourished, and in no acute distress.  Neuro: Alert and oriented x3, extra-ocular muscles intact, sensation grossly intact.  HEENT: Normocephalic, atraumatic  Skin: Warm and dry, no rashes. Cardiac: Regular rate and rhythm, no murmurs rubs or gallops, no lower extremity edema.  Respiratory: Clear to auscultation bilaterally. Not using accessory muscles, speaking in full sentences.   Impression and Recommendations:   HTN- Continue current regimen with blood pressure medication for now and will continue work on diet and exercise.she is actually taking 1.5 tabs of the losartan.    Alcohol abuse -  still not drinking which is absolutely fantastic.  Obesity/BMI of 32-we had a long discussion today about strategies and getting back on track with diet and exercise. Because of her  knee problems I do think her exercises can be very limited but she can certainly work on chair stretches and exercises and really focusing on her diet. I think if she can lose 15-20 pounds would make a huge impact on her blood pressure.  Due for screening mammogram order placed.

## 2016-03-18 ENCOUNTER — Ambulatory Visit (HOSPITAL_BASED_OUTPATIENT_CLINIC_OR_DEPARTMENT_OTHER): Payer: BLUE CROSS/BLUE SHIELD

## 2016-04-25 ENCOUNTER — Other Ambulatory Visit: Payer: Self-pay | Admitting: Family Medicine

## 2016-07-29 ENCOUNTER — Other Ambulatory Visit: Payer: Self-pay | Admitting: Family Medicine

## 2016-07-29 DIAGNOSIS — K529 Noninfective gastroenteritis and colitis, unspecified: Secondary | ICD-10-CM | POA: Insufficient documentation

## 2016-07-29 DIAGNOSIS — E669 Obesity, unspecified: Secondary | ICD-10-CM | POA: Insufficient documentation

## 2016-07-29 DIAGNOSIS — I951 Orthostatic hypotension: Secondary | ICD-10-CM | POA: Insufficient documentation

## 2016-08-16 ENCOUNTER — Telehealth: Payer: Self-pay

## 2016-08-16 NOTE — Telephone Encounter (Signed)
Pt is have arthritis in knee. She was seen the ER an they suggest that she stop taking tylenol and aleve. She would take up to 6 of either med daily. She also tried ibuprofen but that doesn't work for her. I advised pt to come in to see you or Dr. Karie Schwalbe. She declined that offer due to her work schedule. Do you have any suggestions of what pt can take OTC. Please advise.

## 2016-08-16 NOTE — Telephone Encounter (Signed)
Patient was seen for acute renal failure and liver injury. There is not a safe oral medicine for pain over the counter at this time for her.  She really needs to be seen both by either Dr T or myself and by her PCP.  In the interim if she cannot be seen she can use over the counter aspercream.

## 2016-08-16 NOTE — Telephone Encounter (Signed)
Pt notified of recommendations.  I strongly advised her to make appointment while she was on the phone she declined and stated that she will call back

## 2016-08-23 ENCOUNTER — Other Ambulatory Visit: Payer: Self-pay | Admitting: Family Medicine

## 2016-09-09 ENCOUNTER — Encounter: Payer: Self-pay | Admitting: Family Medicine

## 2016-09-09 ENCOUNTER — Telehealth: Payer: Self-pay | Admitting: Sports Medicine

## 2016-09-09 ENCOUNTER — Ambulatory Visit (INDEPENDENT_AMBULATORY_CARE_PROVIDER_SITE_OTHER): Payer: BLUE CROSS/BLUE SHIELD

## 2016-09-09 ENCOUNTER — Telehealth: Payer: Self-pay | Admitting: *Deleted

## 2016-09-09 ENCOUNTER — Ambulatory Visit (INDEPENDENT_AMBULATORY_CARE_PROVIDER_SITE_OTHER): Payer: BLUE CROSS/BLUE SHIELD | Admitting: Family Medicine

## 2016-09-09 VITALS — BP 124/78 | HR 72 | Ht 61.0 in | Wt 181.0 lb

## 2016-09-09 DIAGNOSIS — R7309 Other abnormal glucose: Secondary | ICD-10-CM | POA: Diagnosis not present

## 2016-09-09 DIAGNOSIS — R112 Nausea with vomiting, unspecified: Secondary | ICD-10-CM

## 2016-09-09 DIAGNOSIS — M1711 Unilateral primary osteoarthritis, right knee: Secondary | ICD-10-CM | POA: Insufficient documentation

## 2016-09-09 DIAGNOSIS — R748 Abnormal levels of other serum enzymes: Secondary | ICD-10-CM

## 2016-09-09 DIAGNOSIS — R7301 Impaired fasting glucose: Secondary | ICD-10-CM | POA: Diagnosis not present

## 2016-09-09 DIAGNOSIS — I10 Essential (primary) hypertension: Secondary | ICD-10-CM | POA: Diagnosis not present

## 2016-09-09 DIAGNOSIS — M170 Bilateral primary osteoarthritis of knee: Secondary | ICD-10-CM

## 2016-09-09 LAB — POCT GLYCOSYLATED HEMOGLOBIN (HGB A1C): Hemoglobin A1C: 5.9

## 2016-09-09 MED ORDER — METOPROLOL SUCCINATE ER 100 MG PO TB24: ORAL_TABLET | ORAL | 1 refills | Status: DC

## 2016-09-09 MED ORDER — DICLOFENAC SODIUM 1 % TD GEL: 2.0000 g | Freq: Four times a day (QID) | TRANSDERMAL | 5 refills | Status: DC

## 2016-09-09 MED ORDER — LOSARTAN POTASSIUM-HCTZ 100-12.5 MG PO TABS: 1.0000 | ORAL_TABLET | Freq: Every day | ORAL | 1 refills | Status: DC

## 2016-09-09 NOTE — Progress Notes (Signed)
   Subjective:    I'm seeing this patient as a consultation for:  Dr. Nani Gasseratherine Metheney  CC: Bilateral knee pain  HPI: This is a pleasant 65 year old female, she's had years of bilateral knee pain, injections in the past with good results, pain is recurrent, moderate, persistent, localized at the joint lines. No radiation, no mechanical symptoms.  Past medical history:  Negative.  See flowsheet/record as well for more information.  Surgical history: Negative.  See flowsheet/record as well for more information.  Family history: Negative.  See flowsheet/record as well for more information.  Social history: Negative.  See flowsheet/record as well for more information.  Allergies, and medications have been entered into the medical record, reviewed, and no changes needed.   Review of Systems: No headache, visual changes, nausea, vomiting, diarrhea, constipation, dizziness, abdominal pain, skin rash, fevers, chills, night sweats, weight loss, swollen lymph nodes, body aches, joint swelling, muscle aches, chest pain, shortness of breath, mood changes, visual or auditory hallucinations.   Objective:   General: Well Developed, well nourished, and in no acute distress.  Neuro/Psych: Alert and oriented x3, extra-ocular muscles intact, able to move all 4 extremities, sensation grossly intact. Skin: Warm and dry, no rashes noted.  Respiratory: Not using accessory muscles, speaking in full sentences, trachea midline.  Cardiovascular: Pulses palpable, no extremity edema. Abdomen: Does not appear distended. Bilateral knees: Normal to inspection with no erythema or effusion or obvious bony abnormalities. Palpation normal with no warmth or joint line tenderness or patellar tenderness or condyle tenderness. ROM normal in flexion and extension and lower leg rotation. Ligaments with solid consistent endpoints including ACL, PCL, LCL, MCL. Negative Mcmurray's and provocative meniscal tests. Non painful  patellar compression. Patellar and quadriceps tendons unremarkable. Hamstring and quadriceps strength is normal.  Procedure: Real-time Ultrasound Guided Injection of left knee Device: GE Logiq E  Verbal informed consent obtained.  Time-out conducted.  Noted no overlying erythema, induration, or other signs of local infection.  Skin prepped in a sterile fashion.  Local anesthesia: Topical Ethyl chloride.  With sterile technique and under real time ultrasound guidance:  1 mL kenalog 40, 2 mL lidocaine, 2 mL Marcaine injected easily Completed without difficulty  Pain immediately resolved suggesting accurate placement of the medication.  Advised to call if fevers/chills, erythema, induration, drainage, or persistent bleeding.  Images permanently stored and available for review in the ultrasound unit.  Impression: Technically successful ultrasound guided injection.  Procedure: Real-time Ultrasound Guided Injection of right knee Device: GE Logiq E  Verbal informed consent obtained.  Time-out conducted.  Noted no overlying erythema, induration, or other signs of local infection.  Skin prepped in a sterile fashion.  Local anesthesia: Topical Ethyl chloride.  With sterile technique and under real time ultrasound guidance:  1 mL kenalog 40, 2 mL lidocaine, 2 mL Marcaine injected easily Completed without difficulty  Pain immediately resolved suggesting accurate placement of the medication.  Advised to call if fevers/chills, erythema, induration, drainage, or persistent bleeding.  Images permanently stored and available for review in the ultrasound unit.  Impression: Technically successful ultrasound guided injection.  Impression and Recommendations:   This case required medical decision making of moderate complexity.  Primary osteoarthritis of both knees Bilateral injection, ordering updated x-rays, I'm also going to get her set up for viscous supplementation.

## 2016-09-09 NOTE — Assessment & Plan Note (Signed)
Bilateral injection, ordering updated x-rays, I'm also going to get her set up for viscous supplementation.

## 2016-09-09 NOTE — Telephone Encounter (Signed)
Prior auth submitted and approved for Diclofenac gel. No home # listed. [id number] - PA Case ID: [id number].called and left message on pharmacy vm

## 2016-09-09 NOTE — Telephone Encounter (Signed)
-----   Message from Monica Bectonhomas J Thekkekandam, MD sent at 09/09/2016  9:52 AM EST ----- Bilateral OrthoVisc approval please. _________________________________________ Ihor Austinhomas J. Benjamin Stainhekkekandam, M.D., ABFM., CAQSM. Primary Care and Sports Medicine [facility]  Adjunct Instructor of Family Medicine  [employer]

## 2016-09-09 NOTE — Addendum Note (Signed)
Addended by: Deno EtienneBARKLEY, Kersti Scavone L on: 09/09/2016 09:48 AM   Modules accepted: Orders

## 2016-09-09 NOTE — Addendum Note (Signed)
Addended by: Monica BectonHEKKEKANDAM, THOMAS J on: 09/09/2016 09:52 AM   Modules accepted: Orders

## 2016-09-09 NOTE — Progress Notes (Signed)
Subjective:    CC: HTN  HPI:  Hypertension- Pt denies chest pain, SOB, dizziness, or heart palpitations.  Taking meds as directed w/o problems.  Denies medication side effects.    Patient was also recently seen in the emergency department, October 23, for nausea and vomiting. She had an acute kidney injury and was dehydrated at that time. They were able to rehydrate her and get her renal function back down to 0.92. They actually recommended that she also follow-up with GI since she had elevated liver enzymes. Since then she's been feeling much better.  She is most concerned with her bilateral knee pain. Now that she's had the back of her Aleve after she had the acute kidney injury she really isn't left with much else to take. She says Tylenol does not work for her and with her recent elevation in liver enzymes she's been advised to avoid Tylenol as well. They recommended that she follow with me to discuss treatment options. She said she was told years ago that she really needed bilateral knee replacements. She has undergone injections and Visco supplementation years ago. Nothing recently.  Past medical history, Surgical history, Family history not pertinant except as noted below, Social history, Allergies, and medications have been entered into the medical record, reviewed, and corrections made.   Review of Systems: No fevers, chills, night sweats, weight loss, chest pain, or shortness of breath.   Objective:    General: Well Developed, well nourished, and in no acute distress.  Neuro: Alert and oriented x3, extra-ocular muscles intact, sensation grossly intact.  HEENT: Normocephalic, atraumatic  Skin: Warm and dry, no rashes. Cardiac: Regular rate and rhythm, no murmurs rubs or gallops, no lower extremity edema.  Respiratory: Clear to auscultation bilaterally. Not using accessory muscles, speaking in full sentences.   Impression and Recommendations:   HTN - Well controlled. Continue  current regimen. Follow up in  6 months.    N/V - Resolved.  She has also dec her Aleve.    Abnormal glucose - check A1C.  5.9. Consistent with impaired fasting glucose. Discussed dietary changes. Recommend recheck in 6 months.  Elevated liver enzymes - she says they will recheck her at the end of the month. They think it may be coming from her gallbladder and gallstones. In fact she has a consultation with the surgeon later this month for removal.  Bilateral knee asked arthritis-we'll try Voltaren gel. We'll have her see one of our sports medicine doctors for bilateral knee injections will see if this will help get her through the holidays.

## 2016-09-09 NOTE — Telephone Encounter (Signed)
Submitted for approval on Orthovisc. Awaiting confirmation.  

## 2016-09-09 NOTE — Patient Instructions (Signed)
Please consider getting your mammogram this year.

## 2016-09-10 NOTE — Telephone Encounter (Signed)
Received the following information from OV benefits investigation:  As of December 1st, [employer] will no longer consider HA products medically necessary. All claims submitted 09/06/2016 will be denied. [id number] is also not covered. REF: [id number]: [id number]   Spoke with Pt, will try and resubmit after 1/18 and see if benefits will change.

## 2016-09-10 NOTE — Telephone Encounter (Signed)
Patient may change insurance, she will contact office if she does.

## 2016-09-26 ENCOUNTER — Telehealth: Payer: Self-pay

## 2016-09-26 MED ORDER — BENZONATATE 200 MG PO CAPS: 200.0000 mg | ORAL_CAPSULE | Freq: Two times a day (BID) | ORAL | 0 refills | Status: DC | PRN

## 2016-09-26 NOTE — Telephone Encounter (Signed)
Left VM for Pt to return clinic call, callback information provided. 

## 2016-09-26 NOTE — Telephone Encounter (Signed)
[facility]. She could also consider trying Delsym over-the-counter as well she has not tried any over-the-counter medication. With her prior history of alcohol use I did not want to prescribe anything that has a narcotic for her. If she's not feeling better after the holidays and encourage her to make an appointment.  Nani Gasseratherine Metheney, MD

## 2016-09-26 NOTE — Telephone Encounter (Signed)
Alison RhodesBetty reports a dry cough for 1.5 weeks. Denies nausea, vomiting, fever, chills, sweats, nasal congestion or drainage in throat. I tried to schedule her for an acute visit and she stated she doesn't have the time. She would like something for her cough. Please advise.

## 2016-10-15 NOTE — Telephone Encounter (Signed)
Submitted for approval on Orthovisc. Awaiting confirmation.  

## 2016-10-18 ENCOUNTER — Telehealth: Payer: Self-pay | Admitting: Family Medicine

## 2016-10-18 NOTE — Telephone Encounter (Signed)
Pt just applied for medicaid. Will wait for new insurance card.

## 2016-10-18 NOTE — Telephone Encounter (Signed)
Pt recently changed insurance to Medicare and will start using mail order for her prescriptions. Pt states if she switches to pantoprazole 40mg  her rx will be free rather than paying for nexium. They will fax us an order form for completion once all details are finalized.

## 2016-10-21 ENCOUNTER — Other Ambulatory Visit: Payer: Self-pay

## 2016-10-21 MED ORDER — LOSARTAN POTASSIUM-HCTZ 100-12.5 MG PO TABS: 1.0000 | ORAL_TABLET | Freq: Every day | ORAL | 1 refills | Status: DC

## 2016-10-21 MED ORDER — METOPROLOL SUCCINATE ER 100 MG PO TB24: ORAL_TABLET | ORAL | 1 refills | Status: DC

## 2016-10-29 NOTE — Telephone Encounter (Signed)
Submitted for approval on Orthovisc with new insurance. Awaiting confirmation.

## 2016-11-01 NOTE — Telephone Encounter (Signed)
Received the following information from OV benefits investigation:   Patient has a [employer] Medicare Complete Plan I HMO Plan with an effective date of 10/07/2016. [id number] is covered at 80% & [id number] is covered at 100% of the contracted rate when performed in an office setting. $50.00 copay for specialist office visit applies. If out of pocket is met, coverage goes to 100% & copay will no longer apply. Reference: [id number]  Spoke with Pt about estimated OOP. She would like to proceed with OV injections. First appt made for next week.

## 2016-11-05 ENCOUNTER — Ambulatory Visit (INDEPENDENT_AMBULATORY_CARE_PROVIDER_SITE_OTHER): Payer: Medicare Other | Admitting: Sports Medicine

## 2016-11-05 DIAGNOSIS — M170 Bilateral primary osteoarthritis of knee: Secondary | ICD-10-CM

## 2016-11-05 NOTE — Assessment & Plan Note (Signed)
Orthovisc injection #1 of 4 into both knees, return in one week for #2 

## 2016-11-05 NOTE — Progress Notes (Signed)

## 2016-11-07 ENCOUNTER — Telehealth: Payer: Self-pay | Admitting: Family Medicine

## 2016-11-07 NOTE — Telephone Encounter (Signed)
Pt needs to schedule Welcome to Medicare appt with Albin Fellingarla. I was not able to leave a VM with number provided, mn

## 2016-11-11 ENCOUNTER — Encounter: Payer: Self-pay | Admitting: Sports Medicine

## 2016-11-11 ENCOUNTER — Ambulatory Visit (INDEPENDENT_AMBULATORY_CARE_PROVIDER_SITE_OTHER): Payer: Medicare Other | Admitting: Sports Medicine

## 2016-11-11 DIAGNOSIS — M170 Bilateral primary osteoarthritis of knee: Secondary | ICD-10-CM | POA: Diagnosis not present

## 2016-11-11 NOTE — Assessment & Plan Note (Signed)
Orthovisc injection #2 of 4 into both knees, starting to feel a bit better. Declines Valium for preprocedural anxiolysis.

## 2016-11-11 NOTE — Progress Notes (Signed)

## 2016-11-18 ENCOUNTER — Encounter: Payer: Self-pay | Admitting: Sports Medicine

## 2016-11-18 ENCOUNTER — Other Ambulatory Visit: Payer: Self-pay | Admitting: Sports Medicine

## 2016-11-18 ENCOUNTER — Ambulatory Visit (INDEPENDENT_AMBULATORY_CARE_PROVIDER_SITE_OTHER): Payer: Medicare Other | Admitting: Sports Medicine

## 2016-11-18 DIAGNOSIS — M170 Bilateral primary osteoarthritis of knee: Secondary | ICD-10-CM | POA: Diagnosis not present

## 2016-11-18 MED ORDER — PANTOPRAZOLE SODIUM 40 MG PO TBEC: 40.0000 mg | DELAYED_RELEASE_TABLET | Freq: Every day | ORAL | 3 refills | Status: DC

## 2016-11-18 NOTE — Assessment & Plan Note (Signed)
Orthovisc injection #3 into both knees, return in one week for #4 

## 2016-11-18 NOTE — Progress Notes (Signed)

## 2016-11-25 ENCOUNTER — Ambulatory Visit (INDEPENDENT_AMBULATORY_CARE_PROVIDER_SITE_OTHER): Payer: Medicare Other | Admitting: Sports Medicine

## 2016-11-25 ENCOUNTER — Encounter: Payer: Self-pay | Admitting: Sports Medicine

## 2016-11-25 DIAGNOSIS — M170 Bilateral primary osteoarthritis of knee: Secondary | ICD-10-CM | POA: Diagnosis not present

## 2016-11-25 NOTE — Progress Notes (Signed)

## 2016-11-25 NOTE — Addendum Note (Signed)
Addended by: Baird KayUGLAS, Nyasha Rahilly M on: 11/25/2016 02:13 PM   Modules accepted: Orders

## 2016-11-25 NOTE — Assessment & Plan Note (Signed)
Orthovisc injection #4 into both knees. Return as needed.

## 2017-01-21 DIAGNOSIS — K8020 Calculus of gallbladder without cholecystitis without obstruction: Secondary | ICD-10-CM | POA: Diagnosis not present

## 2017-01-28 DIAGNOSIS — K760 Fatty (change of) liver, not elsewhere classified: Secondary | ICD-10-CM | POA: Diagnosis not present

## 2017-01-28 DIAGNOSIS — M1990 Unspecified osteoarthritis, unspecified site: Secondary | ICD-10-CM | POA: Diagnosis not present

## 2017-01-28 DIAGNOSIS — Z791 Long term (current) use of non-steroidal anti-inflammatories (NSAID): Secondary | ICD-10-CM | POA: Diagnosis not present

## 2017-01-28 DIAGNOSIS — K811 Chronic cholecystitis: Secondary | ICD-10-CM | POA: Diagnosis not present

## 2017-01-28 DIAGNOSIS — K219 Gastro-esophageal reflux disease without esophagitis: Secondary | ICD-10-CM | POA: Diagnosis not present

## 2017-01-28 DIAGNOSIS — M170 Bilateral primary osteoarthritis of knee: Secondary | ICD-10-CM | POA: Diagnosis not present

## 2017-01-28 DIAGNOSIS — I10 Essential (primary) hypertension: Secondary | ICD-10-CM | POA: Diagnosis not present

## 2017-01-28 DIAGNOSIS — Z8249 Family history of ischemic heart disease and other diseases of the circulatory system: Secondary | ICD-10-CM | POA: Diagnosis not present

## 2017-01-28 DIAGNOSIS — K8020 Calculus of gallbladder without cholecystitis without obstruction: Secondary | ICD-10-CM | POA: Diagnosis not present

## 2017-01-28 DIAGNOSIS — Z79899 Other long term (current) drug therapy: Secondary | ICD-10-CM | POA: Diagnosis not present

## 2017-02-10 DIAGNOSIS — K5710 Diverticulosis of small intestine without perforation or abscess without bleeding: Secondary | ICD-10-CM | POA: Diagnosis not present

## 2017-02-10 DIAGNOSIS — Z9049 Acquired absence of other specified parts of digestive tract: Secondary | ICD-10-CM | POA: Diagnosis not present

## 2017-02-10 DIAGNOSIS — K265 Chronic or unspecified duodenal ulcer with perforation: Secondary | ICD-10-CM | POA: Diagnosis not present

## 2017-02-10 DIAGNOSIS — R609 Edema, unspecified: Secondary | ICD-10-CM | POA: Diagnosis not present

## 2017-02-10 DIAGNOSIS — K8050 Calculus of bile duct without cholangitis or cholecystitis without obstruction: Secondary | ICD-10-CM | POA: Diagnosis not present

## 2017-02-10 DIAGNOSIS — K219 Gastro-esophageal reflux disease without esophagitis: Secondary | ICD-10-CM | POA: Diagnosis not present

## 2017-02-10 DIAGNOSIS — R188 Other ascites: Secondary | ICD-10-CM | POA: Diagnosis not present

## 2017-02-10 DIAGNOSIS — K5790 Diverticulosis of intestine, part unspecified, without perforation or abscess without bleeding: Secondary | ICD-10-CM | POA: Diagnosis not present

## 2017-02-10 DIAGNOSIS — T814XXA Infection following a procedure, initial encounter: Secondary | ICD-10-CM | POA: Diagnosis not present

## 2017-02-10 DIAGNOSIS — R109 Unspecified abdominal pain: Secondary | ICD-10-CM | POA: Diagnosis not present

## 2017-02-10 DIAGNOSIS — B999 Unspecified infectious disease: Secondary | ICD-10-CM | POA: Diagnosis not present

## 2017-02-10 DIAGNOSIS — N179 Acute kidney failure, unspecified: Secondary | ICD-10-CM | POA: Diagnosis not present

## 2017-02-10 DIAGNOSIS — R110 Nausea: Secondary | ICD-10-CM | POA: Diagnosis not present

## 2017-02-10 DIAGNOSIS — R748 Abnormal levels of other serum enzymes: Secondary | ICD-10-CM | POA: Diagnosis not present

## 2017-02-10 DIAGNOSIS — K750 Abscess of liver: Secondary | ICD-10-CM | POA: Diagnosis not present

## 2017-02-10 DIAGNOSIS — I959 Hypotension, unspecified: Secondary | ICD-10-CM | POA: Diagnosis not present

## 2017-02-10 DIAGNOSIS — R933 Abnormal findings on diagnostic imaging of other parts of digestive tract: Secondary | ICD-10-CM | POA: Diagnosis not present

## 2017-02-10 DIAGNOSIS — R932 Abnormal findings on diagnostic imaging of liver and biliary tract: Secondary | ICD-10-CM | POA: Diagnosis not present

## 2017-02-10 DIAGNOSIS — K859 Acute pancreatitis without necrosis or infection, unspecified: Secondary | ICD-10-CM | POA: Diagnosis not present

## 2017-02-10 DIAGNOSIS — K651 Peritoneal abscess: Secondary | ICD-10-CM | POA: Diagnosis not present

## 2017-02-10 DIAGNOSIS — K838 Other specified diseases of biliary tract: Secondary | ICD-10-CM | POA: Diagnosis not present

## 2017-02-10 DIAGNOSIS — R1010 Upper abdominal pain, unspecified: Secondary | ICD-10-CM | POA: Diagnosis not present

## 2017-02-10 DIAGNOSIS — E860 Dehydration: Secondary | ICD-10-CM | POA: Diagnosis not present

## 2017-02-20 DIAGNOSIS — K265 Chronic or unspecified duodenal ulcer with perforation: Secondary | ICD-10-CM | POA: Insufficient documentation

## 2017-03-10 ENCOUNTER — Other Ambulatory Visit: Payer: Self-pay | Admitting: Family Medicine

## 2017-04-12 DIAGNOSIS — R748 Abnormal levels of other serum enzymes: Secondary | ICD-10-CM | POA: Diagnosis not present

## 2017-04-12 DIAGNOSIS — Z79899 Other long term (current) drug therapy: Secondary | ICD-10-CM | POA: Diagnosis not present

## 2017-04-12 DIAGNOSIS — R109 Unspecified abdominal pain: Secondary | ICD-10-CM | POA: Diagnosis not present

## 2017-04-12 DIAGNOSIS — K219 Gastro-esophageal reflux disease without esophagitis: Secondary | ICD-10-CM | POA: Diagnosis not present

## 2017-04-12 DIAGNOSIS — R1011 Right upper quadrant pain: Secondary | ICD-10-CM | POA: Diagnosis not present

## 2017-04-12 DIAGNOSIS — R1012 Left upper quadrant pain: Secondary | ICD-10-CM | POA: Diagnosis not present

## 2017-04-12 DIAGNOSIS — R945 Abnormal results of liver function studies: Secondary | ICD-10-CM | POA: Diagnosis not present

## 2017-04-12 DIAGNOSIS — K831 Obstruction of bile duct: Secondary | ICD-10-CM | POA: Diagnosis not present

## 2017-04-12 DIAGNOSIS — R7989 Other specified abnormal findings of blood chemistry: Secondary | ICD-10-CM | POA: Diagnosis not present

## 2017-04-12 DIAGNOSIS — R933 Abnormal findings on diagnostic imaging of other parts of digestive tract: Secondary | ICD-10-CM | POA: Diagnosis not present

## 2017-04-12 DIAGNOSIS — K8050 Calculus of bile duct without cholangitis or cholecystitis without obstruction: Secondary | ICD-10-CM | POA: Diagnosis not present

## 2017-04-12 DIAGNOSIS — K830 Cholangitis: Secondary | ICD-10-CM | POA: Diagnosis not present

## 2017-04-12 DIAGNOSIS — Z9049 Acquired absence of other specified parts of digestive tract: Secondary | ICD-10-CM | POA: Diagnosis not present

## 2017-04-12 DIAGNOSIS — K838 Other specified diseases of biliary tract: Secondary | ICD-10-CM | POA: Diagnosis not present

## 2017-04-12 DIAGNOSIS — K839 Disease of biliary tract, unspecified: Secondary | ICD-10-CM | POA: Diagnosis not present

## 2017-04-12 DIAGNOSIS — K5710 Diverticulosis of small intestine without perforation or abscess without bleeding: Secondary | ICD-10-CM | POA: Diagnosis not present

## 2017-04-12 DIAGNOSIS — R7881 Bacteremia: Secondary | ICD-10-CM | POA: Diagnosis not present

## 2017-04-12 DIAGNOSIS — R1013 Epigastric pain: Secondary | ICD-10-CM | POA: Diagnosis not present

## 2017-04-12 DIAGNOSIS — R1010 Upper abdominal pain, unspecified: Secondary | ICD-10-CM | POA: Diagnosis not present

## 2017-04-12 DIAGNOSIS — I10 Essential (primary) hypertension: Secondary | ICD-10-CM | POA: Diagnosis not present

## 2017-04-21 DIAGNOSIS — R740 Nonspecific elevation of levels of transaminase and lactic acid dehydrogenase [LDH]: Secondary | ICD-10-CM | POA: Diagnosis not present

## 2017-04-21 DIAGNOSIS — K8050 Calculus of bile duct without cholangitis or cholecystitis without obstruction: Secondary | ICD-10-CM | POA: Diagnosis not present

## 2017-04-22 DIAGNOSIS — R945 Abnormal results of liver function studies: Secondary | ICD-10-CM

## 2017-04-22 DIAGNOSIS — R7989 Other specified abnormal findings of blood chemistry: Secondary | ICD-10-CM | POA: Insufficient documentation

## 2017-04-23 ENCOUNTER — Telehealth: Payer: Self-pay | Admitting: Family Medicine

## 2017-04-23 ENCOUNTER — Inpatient Hospital Stay: Payer: Medicare Other | Admitting: Family Medicine

## 2017-04-23 DIAGNOSIS — Z0189 Encounter for other specified special examinations: Secondary | ICD-10-CM

## 2017-04-23 NOTE — Telephone Encounter (Signed)
I called patient about her missed appointment and she stated she did not know she had an appointment with you. She said the hospital must have made the appointment and didn't communicate it with her because she wouldn't have missed it if she had known. I asked her if everything was ok and she said yes that she followed up with the [facility]. I asked if she wanted to go ahead and reschedule and she said she didn't see a need because they solved the problem when she was in the hospital. Thanks

## 2017-04-23 NOTE — Telephone Encounter (Signed)
Please call patient: I saw that she missed her appointment for hospital follow-up today. If swelling to check on her and make sure that she was okay, and get her rescheduled as soon as possible.

## 2017-05-09 ENCOUNTER — Encounter: Payer: Self-pay | Admitting: Sports Medicine

## 2017-05-09 ENCOUNTER — Ambulatory Visit (INDEPENDENT_AMBULATORY_CARE_PROVIDER_SITE_OTHER): Payer: Medicare Other | Admitting: Sports Medicine

## 2017-05-09 DIAGNOSIS — M170 Bilateral primary osteoarthritis of knee: Secondary | ICD-10-CM | POA: Diagnosis not present

## 2017-05-09 MED ORDER — CELECOXIB 100 MG PO CAPS: ORAL_CAPSULE | ORAL | 3 refills | Status: DC

## 2017-05-09 NOTE — Progress Notes (Signed)
  Subjective:    CC: Bilateral knee pain  HPI: Alison [name] is a pleasant 66 year old female with knee osteoarthritis, we have done steroid injections as well as Orthovisc injections back in February, she had only about a week of relief, never came back. She's now noted increase in pain, moderate, persistent. Localizes at the joint lines with gelling, no mechanical symptoms. She does desire repeat interventional treatment today.  Past medical history:  Negative.  See flowsheet/record as well for more information.  Surgical history: Negative.  See flowsheet/record as well for more information.  Family history: Negative.  See flowsheet/record as well for more information.  Social history: Negative.  See flowsheet/record as well for more information.  Allergies, and medications have been entered into the medical record, reviewed, and no changes needed.   Review of Systems: No fevers, chills, night sweats, weight loss, chest pain, or shortness of breath.   Objective:    General: Well Developed, well nourished, and in no acute distress.  Neuro: Alert and oriented x3, extra-ocular muscles intact, sensation grossly intact.  HEENT: Normocephalic, atraumatic, pupils equal round reactive to light, neck supple, no masses, no lymphadenopathy, thyroid nonpalpable.  Skin: Warm and dry, no rashes. Cardiac: Regular rate and rhythm, no murmurs rubs or gallops, no lower extremity edema.  Respiratory: Clear to auscultation bilaterally. Not using accessory muscles, speaking in full sentences. Bilateral knees: Minimally swollen, tenderness at the medial joint lines. ROM normal in flexion and extension and lower leg rotation. Ligaments with solid consistent endpoints including ACL, PCL, LCL, MCL. Negative Mcmurray's and provocative meniscal tests. Non painful patellar compression. Patellar and quadriceps tendons unremarkable. Hamstring and quadriceps strength is normal.  Procedure: Real-time Ultrasound Guided  Injection of left knee Device: GE Logiq E  Verbal informed consent obtained.  Time-out conducted.  Noted no overlying erythema, induration, or other signs of local infection.  Skin prepped in a sterile fashion.  Local anesthesia: Topical Ethyl chloride.  With sterile technique and under real time ultrasound guidance:  1 mL kenalog 40, 2 mL lidocaine, 2 mL bupivacaine injected easily Completed without difficulty  Pain immediately resolved suggesting accurate placement of the medication.  Advised to call if fevers/chills, erythema, induration, drainage, or persistent bleeding.  Images permanently stored and available for review in the ultrasound unit.  Impression: Technically successful ultrasound guided injection.  Procedure: Real-time Ultrasound Guided Injection of right knee Device: GE Logiq E  Verbal informed consent obtained.  Time-out conducted.  Noted no overlying erythema, induration, or other signs of local infection.  Skin prepped in a sterile fashion.  Local anesthesia: Topical Ethyl chloride.  With sterile technique and under real time ultrasound guidance:  1 mL kenalog 40, 2 mL lidocaine, 2 mL bupivacaine injected easily Completed without difficulty  Pain immediately resolved suggesting accurate placement of the medication.  Advised to call if fevers/chills, erythema, induration, drainage, or persistent bleeding.  Images permanently stored and available for review in the ultrasound unit.  Impression: Technically successful ultrasound guided injection.  Impression and Recommendations:    Primary osteoarthritis of both knees Didn't really get much relief from Viscosupplementation. Steroid injections today, referral to orthopedic surgery for consideration of bilateral arthroplasty. She does desire to schedule surgery for sometime early next year. Does have a history of peptic ulcer disease, she will continue Nexium and start low-dose Celebrex. I've also given her some  Pennsaid samples. Caution advised and warning signs discussed. Return for custom orthotics.

## 2017-05-09 NOTE — Assessment & Plan Note (Signed)
Didn't really get much relief from Viscosupplementation. Steroid injections today, referral to orthopedic surgery for consideration of bilateral arthroplasty. She does desire to schedule surgery for sometime early next year. Does have a history of peptic ulcer disease, she will continue Nexium and start low-dose Celebrex. I've also given her some Pennsaid samples. Caution advised and warning signs discussed. Return for custom orthotics.

## 2017-05-20 DIAGNOSIS — K219 Gastro-esophageal reflux disease without esophagitis: Secondary | ICD-10-CM | POA: Diagnosis not present

## 2017-05-20 DIAGNOSIS — Z466 Encounter for fitting and adjustment of urinary device: Secondary | ICD-10-CM | POA: Diagnosis not present

## 2017-05-20 DIAGNOSIS — M1990 Unspecified osteoarthritis, unspecified site: Secondary | ICD-10-CM | POA: Diagnosis not present

## 2017-05-20 DIAGNOSIS — K8050 Calculus of bile duct without cholangitis or cholecystitis without obstruction: Secondary | ICD-10-CM | POA: Diagnosis not present

## 2017-05-20 DIAGNOSIS — I10 Essential (primary) hypertension: Secondary | ICD-10-CM | POA: Diagnosis not present

## 2017-05-20 DIAGNOSIS — K5710 Diverticulosis of small intestine without perforation or abscess without bleeding: Secondary | ICD-10-CM | POA: Diagnosis not present

## 2017-05-20 DIAGNOSIS — R7989 Other specified abnormal findings of blood chemistry: Secondary | ICD-10-CM | POA: Diagnosis not present

## 2017-05-20 DIAGNOSIS — Z48815 Encounter for surgical aftercare following surgery on the digestive system: Secondary | ICD-10-CM | POA: Diagnosis not present

## 2017-05-26 ENCOUNTER — Other Ambulatory Visit: Payer: Self-pay

## 2017-05-26 MED ORDER — METOPROLOL SUCCINATE ER 100 MG PO TB24: 100.0000 mg | ORAL_TABLET | Freq: Every day | ORAL | 0 refills | Status: DC

## 2017-06-02 ENCOUNTER — Encounter: Payer: Medicare Other | Admitting: Sports Medicine

## 2017-06-03 ENCOUNTER — Ambulatory Visit (INDEPENDENT_AMBULATORY_CARE_PROVIDER_SITE_OTHER): Payer: Medicare Other | Admitting: Family Medicine

## 2017-06-03 ENCOUNTER — Encounter: Payer: Self-pay | Admitting: Family Medicine

## 2017-06-03 VITALS — BP 142/84 | HR 75 | Wt 169.0 lb

## 2017-06-03 DIAGNOSIS — R7301 Impaired fasting glucose: Secondary | ICD-10-CM

## 2017-06-03 DIAGNOSIS — F439 Reaction to severe stress, unspecified: Secondary | ICD-10-CM | POA: Diagnosis not present

## 2017-06-03 DIAGNOSIS — K219 Gastro-esophageal reflux disease without esophagitis: Secondary | ICD-10-CM

## 2017-06-03 DIAGNOSIS — I10 Essential (primary) hypertension: Secondary | ICD-10-CM

## 2017-06-03 LAB — POCT GLYCOSYLATED HEMOGLOBIN (HGB A1C): Hemoglobin A1C: 5.8

## 2017-06-03 MED ORDER — LOSARTAN POTASSIUM-HCTZ 100-12.5 MG PO TABS: 1.0000 | ORAL_TABLET | Freq: Every day | ORAL | 3 refills | Status: DC

## 2017-06-03 NOTE — Progress Notes (Signed)
Subjective:    CC: BP, glucose  HPI:  Hypertension- Pt denies chest pain, SOB, dizziness, or heart palpitations.  Taking meds as directed w/o problems.  Denies medication side effects.    Impaired fasting glucose-no increased thirst or urination. No symptoms consistent with hypoglycemia.  GERD - currently on Nexium 20 mg daily.   Note hospital d/c  From 04/21/18: "The patient is a 66 year old female has a history of hypertension, GERD, arthritis that was admitted to the hospital from 04/12/2017 and discharged on 04/16/2017. She initially came in with abdominal pain over the last 4 weeks. She had a prolonged hospital stay back in May for laparoscopic cholecystectomy, located by fluid collection in the gallbladder fossa with a small volume pneumoperitoneum possibly secondary to postoperative abscess versus duodenal perforation. A percutaneous drain was placed in early May and cultures were positive for gram-negative bacteria. She was conservatively managed and placed on twice a day PPI. Incidentally at the time she was also noted to have a mildly dilated common bile duct of 1.2 cm with a filling defect suggestive of choledocholithiasis. ERCP was deferred at that time secondary to the history of questionable duodenal perforation. Unfortunately after discharge the patient has been having abdominal pain in her epigastrium and had lost about 20 pounds. In the emergency room she was found to have a CT scan with bile duct 7 mm without evidence of choledocholithiasis. Her labs return with alkaline phosphatase 959, bilirubin 3.9, and AST ALT greater than 500. She ended up having an EGD/EUS on April 14, 2017 revealing biliary sludge and a normal EGD. She had a small periampullary diverticulum and an 8 mm lipoma in the area distal to the ampulla. She did have an ERCP on 04/14/2017 revealing biliary debris status post sphincterotomy with a plastic stent placed. Her blood cultures came back positive for Klebsiella pneumonia  susceptible to Zosyn and Levaquin. Her diet was advanced her discharge labs included alkaline phosphatase 329, bilirubin 0.5, ALT 73, AST 18. She was feeling better and was recommended to follow up with [place] in the outpatient setting and consider repeat ERCP in 4 weeks for stent removal. She was to continue and complete a 14 day course of Levaquin which was discussed with infectious disease. Currently the patient reports She is doing very well. She denies any hematochezia, melena, hematemesis, nausea vomiting, abdominal pain. She denies any heartburn, reflux, dysphagia. Her most recent labs were from April 16, 2017 hemoglobin was 9.3, ALT was 73 AST 18, alkaline phosphatase was still elevated at 329 and bilirubin was normal"  She did have the stent removed on 05/20/2017. A biliary sphincterotomy was performed. They ruled out any strictures or narrowing.  She is just feeling a little stressed and overwhelmed over all. 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 And is just trying to catch up.   Past medical history, Surgical history, Family history not pertinant except as noted below, Social history, Allergies, and medications have been entered into the medical record, reviewed, and corrections made.   Review of Systems: No fevers, chills, night sweats, weight loss, chest pain, or shortness of breath.   Objective:    General: Well Developed, well nourished, and in no acute distress.  Neuro: Alert and oriented x3, extra-ocular muscles intact, sensation grossly intact.  HEENT: Normocephalic, atraumatic  Skin: Warm and dry, no rashes. Cardiac:  Regular rate and rhythm, no murmurs rubs or gallops, no lower extremity edema.  Respiratory: Clear to auscultation bilaterally. Not using accessory muscles, speaking in full sentences.   Impression and Recommendations:    HTN - BP borderline  today but she is tearful and anxious.    IFG - stable.  A1C of 5.8.   Continue current regimen we'll follow every 6 months. Continue work on [employer] and food choices  GERD- stable. Well-controlled. Her abdominal symptoms have improved significantly since having the stent removed.  STress - denies depressive symptoms but has just felt a little overwhelmed. She has her face and she has some good support around her. She knows it would get better but just was feeling a little overwhelmed about it today. I asked her she started thinking about drinking again and she said she had had some thoughts but was not planning on doing anything. Asked her to r

## 2017-06-12 ENCOUNTER — Telehealth: Payer: Self-pay | Admitting: *Deleted

## 2017-06-12 DIAGNOSIS — M170 Bilateral primary osteoarthritis of knee: Secondary | ICD-10-CM

## 2017-06-12 MED ORDER — DICLOFENAC SODIUM 2 % TD SOLN: TRANSDERMAL | 3 refills | Status: DC

## 2017-06-12 NOTE — Telephone Encounter (Signed)
Pt would like prescription for pennsaid to be sent to [facility] she stated that Dr. Karie Schwalbe writes this for her.Alison PacasBarkley, Alison [name] [employer]

## 2017-06-25 ENCOUNTER — Telehealth: Payer: Self-pay

## 2017-06-25 DIAGNOSIS — M170 Bilateral primary osteoarthritis of knee: Secondary | ICD-10-CM

## 2017-06-25 MED ORDER — DICLOFENAC SODIUM 2 % TD SOLN: TRANSDERMAL | 3 refills | Status: DC

## 2017-06-25 NOTE — Telephone Encounter (Signed)
Pt called stating that she is out of samples and that the rx is $100 at [facility]. Resent medication to discount pharmacy.

## 2017-07-03 ENCOUNTER — Telehealth: Payer: Self-pay

## 2017-07-03 NOTE — Telephone Encounter (Signed)
Pre Authorization was sent to Cover My Meds. Key: [id number] - PA Case ID: [id number] - Rx #: [id number]

## 2017-07-08 ENCOUNTER — Other Ambulatory Visit: Payer: Self-pay | Admitting: *Deleted

## 2017-07-08 DIAGNOSIS — M170 Bilateral primary osteoarthritis of knee: Secondary | ICD-10-CM

## 2017-07-08 MED ORDER — DICLOFENAC SODIUM 2 % TD SOLN: TRANSDERMAL | 3 refills | Status: DC

## 2017-07-08 NOTE — Progress Notes (Signed)
2 pumps twice a day please.

## 2017-07-08 NOTE — Progress Notes (Signed)
The pharmacy did not fill the Pennsaid  prescription because the tech states the directions were not complete. They needed the amount of pumps to be specified in the directions. 2 pumps once a day or 1 pump up to four times a day. I know the prescription says apply twice a day, but they want it to specifically say how many pumps the patient she use.How would you like for the prescription to be written?

## 2017-07-08 NOTE — Progress Notes (Signed)
Ok done.sent to [facility]

## 2017-09-19 ENCOUNTER — Telehealth: Payer: Self-pay

## 2017-09-19 MED ORDER — DICLOFENAC SODIUM 1 % TD GEL
4.0000 g | Freq: Four times a day (QID) | TRANSDERMAL | 11 refills | Status: DC
Start: 2017-09-19 — End: 2018-03-13

## 2017-09-19 NOTE — Telephone Encounter (Signed)
Alison [name] would like to switched to regular diclofenac 3 % for her bilateral knee pain. The Pennsaid cost too much.

## 2017-09-19 NOTE — Telephone Encounter (Signed)
No problem, switching to Voltaren 1%.

## 2017-09-22 ENCOUNTER — Other Ambulatory Visit: Payer: Self-pay | Admitting: Family Medicine

## 2017-10-09 ENCOUNTER — Other Ambulatory Visit: Payer: Self-pay | Admitting: Family Medicine

## 2017-10-09 DIAGNOSIS — M170 Bilateral primary osteoarthritis of knee: Secondary | ICD-10-CM

## 2017-10-14 ENCOUNTER — Ambulatory Visit (INDEPENDENT_AMBULATORY_CARE_PROVIDER_SITE_OTHER): Payer: Medicare Other | Admitting: Sports Medicine

## 2017-10-14 ENCOUNTER — Encounter: Payer: Self-pay | Admitting: Sports Medicine

## 2017-10-14 DIAGNOSIS — M170 Bilateral primary osteoarthritis of knee: Secondary | ICD-10-CM | POA: Diagnosis not present

## 2017-10-14 NOTE — Progress Notes (Signed)
Subjective:    CC: Knee pain  HPI: Alison [name] is a very pleasant 67 year old female, she has known bilateral knee osteoarthritis.  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  We injected both of her knees about 5 months ago, she had a good response to desires repeat injections, pain is moderate, worsening, localized at the medial joint line of both knees.  She does have vacation coming up.  I reviewed the past medical history, family history, social history, surgical history, and allergies today and no changes were needed.  Please see the problem list section below in epic for further details.  Past Medical History: Past Medical History:  Diagnosis Date  . Endometriosis    Past Surgical History: Past Surgical History:  Procedure Laterality Date  . ABDOMINAL HYSTERECTOMY     Social History: Social History   Socioeconomic History  . Marital status: Married    Spouse name: None  . Number of children: None  . Years of education: None  . Highest education level: None  Social Needs  . Financial resource strain: None  . Food insecurity - worry: None  . Food insecurity - inability: None  . Transportation needs - medical: None  . Transportation needs - non-medical: None  Occupational History  . None  Tobacco Use  . Smoking status: Never Smoker  . Smokeless tobacco: Never Used  Substance and Sexual Activity  . Alcohol use: Yes  . Drug use: No  . Sexual activity: None    Comment: works FT, doesn't regularly exercise  Other Topics Concern  . None  Social History Narrative  . None   Family History: Family History  Problem Relation Age of Onset  . Cancer Mother   . Hyperlipidemia Mother   . Hypertension Mother   . Alcohol abuse Father   . Cancer Unknown   . Heart attack Unknown   . Diabetes Unknown   . Stroke Unknown    Allergies: No Known Allergies Medications: See med rec.  Review of Systems: No fevers, chills, night sweats, weight loss, chest  pain, or shortness of breath.   Objective:    General: Well Developed, well nourished, and in no acute distress.  Neuro: Alert and oriented x3, extra-ocular muscles intact, sensation grossly intact.  HEENT: Normocephalic, atraumatic, pupils equal round reactive to light, neck supple, no masses, no lymphadenopathy, thyroid nonpalpable.  Skin: Warm and dry, no rashes. Cardiac: Regular rate and rhythm, no murmurs rubs or gallops, no lower extremity edema.  Respiratory: Clear to auscultation bilaterally. Not using accessory muscles, speaking in full sentences. Bilateral knees: Minimal effusion bilaterally with tenderness at the medial joint line and patellar facets ROM normal in flexion and extension and lower leg rotation. Ligaments with solid consistent endpoints including ACL, PCL, LCL, MCL. Negative Mcmurray's and provocative meniscal tests. Non painful patellar compression. Patellar and quadriceps tendons unremarkable. Hamstring and quadriceps strength is normal.  Procedure: Real-time Ultrasound Guided Injection of left knee Device: GE Logiq E  Verbal informed consent obtained.  Time-out conducted.  Noted no overlying erythema, induration, or other signs of local infection.  Skin prepped in a sterile fashion.  Local anesthesia: Topical Ethyl chloride.  With sterile technique and under real time ultrasound guidance: 1 cc kenalog 40, 2 cc lidocaine, 2 cc bupivacaine injected easily Completed without difficulty  Pain immediately resolved suggesting accurate placement of the medication.  Advised to call if fevers/chills, erythema, induration, drainage, or persistent bleeding.  Images permanently stored and available for review  in the ultrasound unit.  Impression: Technically successful ultrasound guided injection.  Procedure: Real-time Ultrasound Guided Injection of right knee Device: GE Logiq E  Verbal informed consent obtained.  Time-out conducted.  Noted no overlying erythema,  induration, or other signs of local infection.  Skin prepped in a sterile fashion.  Local anesthesia: Topical Ethyl chloride.  With sterile technique and under real time ultrasound guidance: 1 cc kenalog 40, 2 cc lidocaine, 2 cc bupivacaine injected easily Completed without difficulty  Pain immediately resolved suggesting accurate placement of the medication.  Advised to call if fevers/chills, erythema, induration, drainage, or persistent bleeding.  Images permanently stored and available for review in the ultrasound unit.  Impression: Technically successful ultrasound guided injection.  Impression and Recommendations:    Primary osteoarthritis of both knees Visco supplementation was really not effective, not yet ready to consider arthroplasty, previous injection was about 5 months ago. Repeat bilateral knee joint injection and she will continue Nexium and low-dose Celebrex. I did give her some more samples of Pennsaid today. Return as needed. ___________________________________________ [name]. [name], M.D., ABFM., CAQSM. Primary Care and Sports Medicine [facility]  Adjunct Instructor of Family Medicine  [employer]

## 2017-10-14 NOTE — Assessment & Plan Note (Signed)
Visco supplementation was really not effective, not yet ready to consider arthroplasty, previous injection was about 5 months ago. Repeat bilateral knee joint injection and she will continue Nexium and low-dose Celebrex. I did give her some more samples of Pennsaid today. Return as needed.

## 2017-11-10 ENCOUNTER — Encounter: Payer: Self-pay | Admitting: Emergency Medicine

## 2017-11-10 ENCOUNTER — Emergency Department
Admission: EM | Admit: 2017-11-10 | Discharge: 2017-11-10 | Disposition: A | Discharge status: Home / Self Care | Payer: Medicare Other | Source: Home / Self Care | Attending: Family Medicine | Admitting: Family Medicine

## 2017-11-10 DIAGNOSIS — J069 Acute upper respiratory infection, unspecified: Secondary | ICD-10-CM | POA: Diagnosis not present

## 2017-11-10 DIAGNOSIS — B9789 Other viral agents as the cause of diseases classified elsewhere: Secondary | ICD-10-CM | POA: Diagnosis not present

## 2017-11-10 MED ORDER — GUAIFENESIN 100 MG/5ML PO SYRP: 200.0000 mg | ORAL_SOLUTION | ORAL | 0 refills | Status: DC | PRN

## 2017-11-10 MED ORDER — BENZONATATE 100 MG PO CAPS: 100.0000 mg | ORAL_CAPSULE | Freq: Three times a day (TID) | ORAL | 0 refills | Status: DC

## 2017-11-10 NOTE — Discharge Instructions (Signed)
°  You may take 500mg acetaminophen every 4-6 hours or in combination with ibuprofen 400-600mg every 6-8 hours as needed for pain, inflammation, and fever. ° °Be sure to drink at least eight 8oz glasses of water to stay well hydrated and get at least 8 hours of sleep at night, preferably more while sick.  ° °

## 2017-11-10 NOTE — ED Triage Notes (Signed)
Pt c/o cough with mucous, sore throat and chest congestion x 2 days. Denies any meds taken for this.

## 2017-11-10 NOTE — ED Provider Notes (Signed)
[facility]    CSN: [id number] Arrival date & time: 11/10/17  1246     History   Chief Complaint Chief Complaint  Patient presents with  . Cough    HPI Alison [name] is a 67 y.o. female.   HPI  Alison [name] is a 67 y.o. female presenting to UC with c/o mildly productive cough with sore throat, chest congestion and mild HA for 2 days.  She does not get the flu vaccine. Hx of the flu a few years ago but states symptoms today do not feel as bad.  Denies n/v/d. She has not tried anything for her symptoms.  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     Past Medical History:  Diagnosis Date  . Endometriosis     Patient Active Problem List   Diagnosis Date Noted  . Primary osteoarthritis of both knees 09/09/2016  . IFG (impaired fasting glucose) 09/09/2016  . Alcohol abuse 11/29/2013  . Depression 11/29/2013  . HYPERTENSION, BENIGN 05/30/2008  . GERD 05/30/2008    Past Surgical History:  Procedure Laterality Date  . ABDOMINAL HYSTERECTOMY      OB History    No data available       Home Medications    Prior to Admission medications   Medication Sig Start Date End Date Taking? Authorizing Provider  benzonatate (TESSALON) 100 MG capsule Take 1-2 capsules (100-200 mg total) by mouth every 8 (eight) hours. 11/10/17   Lurene ShadowPhelps, Gradyn Shein O, PA-C  celecoxib (CELEBREX) 100 MG capsule One to 2 tablets by mouth daily as needed for pain. 05/09/17   Monica Bectonhekkekandam, Thomas J, MD  diclofenac sodium (VOLTAREN) 1 % GEL Apply 4 g topically 4 (four) times daily. To affected joint. 09/19/17   Monica Bectonhekkekandam, Thomas J, MD  diclofenac sodium (VOLTAREN) 1 % GEL APPLY 2 GRAMS TO AFFECTED AREA(S) FOUR TIMES DAILY 10/09/17   Agapito GamesMetheney, Catherine D, MD  esomeprazole (NEXIUM) 20 MG capsule Take by mouth.    [provider]  guaifenesin (ROBITUSSIN) 100 MG/5ML syrup Take 10-20 mLs (200-400 mg total) by mouth every 4 (four) hours as needed for cough or  congestion (take with large glass of water). 11/10/17   Lurene ShadowPhelps, Arno Cullers O, PA-C  losartan-hydrochlorothiazide (HYZAAR) 100-12.5 MG tablet Take 1 tablet by mouth daily. 06/03/17   Agapito GamesMetheney, Catherine D, MD  metoprolol succinate (TOPROL-XL) 100 MG 24 hr tablet Take 1 tablet (100 mg total) by mouth daily. LAST REFILL. PLEASE CALL OFFICE AND SCHEDULE AN APPOINTMENT 05/26/17   Agapito GamesMetheney, Catherine D, MD  metoprolol succinate (TOPROL-XL) 100 MG 24 hr tablet TAKE ONE TABLET BY MOUTH ONCE DAILY 09/23/17   Agapito GamesMetheney, Catherine D, MD    Family History Family History  Problem Relation Age of Onset  . Cancer Mother   . Hyperlipidemia Mother   . Hypertension Mother   . Alcohol abuse Father   . Cancer Unknown   . Heart attack Unknown   . Diabetes Unknown   . Stroke Unknown     Social History Social History   Tobacco Use  . Smoking status: Never Smoker  . Smokeless tobacco: Never Used  Substance Use Topics  . Alcohol use: Yes  . Drug use: No     Allergies   Patient has no known allergies.   Review of Systems Review of Systems  Constitutional: Positive for chills. Negative for fever.  HENT: Positive for congestion and sore throat. Negative for ear pain, trouble swallowing  and voice change.   Respiratory: Positive for cough. Negative for shortness of breath.   Cardiovascular: Negative for chest pain and palpitations.  Gastrointestinal: Negative for abdominal pain, diarrhea, nausea and vomiting.  Musculoskeletal: Negative for arthralgias, back pain and myalgias.  Skin: Negative for rash.  Neurological: Negative for dizziness, light-headedness and headaches.     Physical Exam Triage Vital Signs ED Triage Vitals [11/10/17 1335]  Enc Vitals Group     BP (!) 160/77     Pulse Rate 88     Resp      Temp 98.5 F (36.9 C)     Temp src      SpO2 97 %     Weight 170 lb (77.1 kg)     Height      Head Circumference      Peak Flow      Pain Score 0     Pain Loc      Pain Edu?      Excl. in  GC?    No data found.  Updated Vital Signs BP (!) 160/77 (BP Location: Right Arm)   Pulse 88   Temp 98.5 F (36.9 C)   Wt 170 lb (77.1 kg)   SpO2 97%   BMI 32.12 kg/m      Physical Exam  Constitutional: She is oriented to person, place, and time. She appears well-developed and well-nourished. No distress.  HENT:  Head: Normocephalic and atraumatic.  Right Ear: Tympanic membrane normal.  Left Ear: Tympanic membrane normal.  Nose: Nose normal. Right sinus exhibits no maxillary sinus tenderness and no frontal sinus tenderness. Left sinus exhibits no maxillary sinus tenderness and no frontal sinus tenderness.  Mouth/Throat: Uvula is midline, oropharynx is clear and moist and mucous membranes are normal.  Eyes: EOM are normal.  Neck: Normal range of motion. Neck supple.  Cardiovascular: Normal rate and regular rhythm.  Pulmonary/Chest: Effort normal and breath sounds normal. No stridor. No respiratory distress. She has no wheezes. She has no rales.  Musculoskeletal: Normal range of motion.  Lymphadenopathy:    She has no cervical adenopathy.  Neurological: She is alert and oriented to person, place, and time.  Skin: Skin is warm and dry. She is not diaphoretic.  Psychiatric: She has a normal mood and affect. Her behavior is normal.  Nursing note and vitals reviewed.    UC Treatments / Results  Labs (all labs ordered are listed, but only abnormal results are displayed) Labs Reviewed - No data to display  EKG  EKG Interpretation None       Radiology No results found.  Procedures Procedures (including critical care time)  Medications Ordered in UC Medications - No data to display   Initial Impression / Assessment and Plan / UC Course  I have reviewed the triage vital signs and the nursing notes.  Pertinent labs & imaging results that were available during my care of the patient were reviewed by me and considered in my medical decision making (see chart for  details).     Hx and exam c/w viral URI w/o evidence of underlying bacterial infection.  Encouraged fluids, rest, acetaminophen and ibuprofen F/u with PCP in 1 week as needed.  Final Clinical Impressions(s) / UC Diagnoses   Final diagnoses:  Viral URI with cough    ED Discharge Orders        Ordered    benzonatate (TESSALON) 100 MG capsule  Every 8 hours     11/10/17 1355  guaifenesin (ROBITUSSIN) 100 MG/5ML syrup  Every 4 hours PRN     11/10/17 1355       Controlled Substance Prescriptions [place] Controlled Substance Registry consulted? Not Applicable   Rolla Plate 11/10/17 1359

## 2018-03-13 ENCOUNTER — Encounter: Payer: Self-pay | Admitting: Sports Medicine

## 2018-03-13 ENCOUNTER — Ambulatory Visit (INDEPENDENT_AMBULATORY_CARE_PROVIDER_SITE_OTHER): Payer: Medicare Other | Admitting: Sports Medicine

## 2018-03-13 DIAGNOSIS — M170 Bilateral primary osteoarthritis of knee: Secondary | ICD-10-CM

## 2018-03-13 MED ORDER — TRAMADOL HCL 50 MG PO TABS: 50.0000 mg | ORAL_TABLET | Freq: Three times a day (TID) | ORAL | 0 refills | Status: DC | PRN

## 2018-03-13 NOTE — Patient Instructions (Signed)
Look into the wobble, the Cupid shuffle, and the [address], and the electric slide.

## 2018-03-13 NOTE — Assessment & Plan Note (Signed)
Bilateral knee injections today, previous injections were in January. Viscosupplementation has not been effective, she does need an arthroplasty. Return as needed, I have advised her to talk about weight loss medication and weight loss treatment with her PCP. Also adding some tramadol for her to use for breakthrough pain. Celebrex ineffective. Return to see me as needed.

## 2018-03-13 NOTE — Progress Notes (Signed)
Subjective:    CC: Knee pain  HPI: Alison [name] is a pleasant 67 year old female, she is here with bilateral knee pain, present for the past several months, she did have an injection back in January.  Her son's wedding is tomorrow.  Pain is moderate, persist, localized to the joint lines without radiation.  I reviewed the past medical history, family history, social history, surgical history, and allergies today and no changes were needed.  Please see the problem list section below in epic for further details.  Past Medical History: Past Medical History:  Diagnosis Date  . Endometriosis    Past Surgical History: Past Surgical History:  Procedure Laterality Date  . ABDOMINAL HYSTERECTOMY     Social History: Social History   Socioeconomic History  . Marital status: Single    Spouse name: Not on file  . Number of children: Not on file  . Years of education: Not on file  . Highest education level: Not on file  Occupational History  . Not on file  Social Needs  . Financial resource strain: Not on file  . Food insecurity:    Worry: Not on file    Inability: Not on file  . Transportation needs:    Medical: Not on file    Non-medical: Not on file  Tobacco Use  . Smoking status: Never Smoker  . Smokeless tobacco: Never Used  Substance and Sexual Activity  . Alcohol use: Yes  . Drug use: No  . Sexual activity: Not on file    Comment: works FT, doesn't regularly exercise  Lifestyle  . Physical activity:    Days per week: Not on file    Minutes per session: Not on file  . Stress: Not on file  Relationships  . Social connections:    Talks on phone: Not on file    Gets together: Not on file    Attends religious service: Not on file    Active member of club or organization: Not on file    Attends meetings of clubs or organizations: Not on file    Relationship status: Not on file  Other Topics Concern  . Not on file  Social History Narrative  . Not on file   Family  History: Family History  Problem Relation Age of Onset  . Cancer Mother   . Hyperlipidemia Mother   . Hypertension Mother   . Alcohol abuse Father   . Cancer Unknown   . Heart attack Unknown   . Diabetes Unknown   . Stroke Unknown    Allergies: No Known Allergies Medications: See med rec.  Review of Systems: No fevers, chills, night sweats, weight loss, chest pain, or shortness of breath.   Objective:    General: Well Developed, well nourished, and in no acute distress.  Neuro: Alert and oriented x3, extra-ocular muscles intact, sensation grossly intact.  HEENT: Normocephalic, atraumatic, pupils equal round reactive to light, neck supple, no masses, no lymphadenopathy, thyroid nonpalpable.  Skin: Warm and dry, no rashes. Cardiac: Regular rate and rhythm, no murmurs rubs or gallops, no lower extremity edema.  Respiratory: Clear to auscultation bilaterally. Not using accessory muscles, speaking in full sentences. Bilateral knees: Swollen, lateral subluxation of the tibia, palpable fluid wave with an effusion.  Tenderness at the medial joint line. ROM normal in flexion and extension and lower leg rotation. Ligaments with solid consistent endpoints including ACL, PCL, LCL, MCL. Negative Mcmurray's and provocative meniscal tests. Non painful patellar compression. Patellar and quadriceps tendons unremarkable. Hamstring  and quadriceps strength is normal.  Procedure: Real-time Ultrasound Guided Injection of Left knee Device: GE Logiq E  Verbal informed consent obtained.  Time-out conducted.  Noted no overlying erythema, induration, or other signs of local infection.  Skin prepped in a sterile fashion.  Local anesthesia: Topical Ethyl chloride.  With sterile technique and under real time ultrasound guidance: 1 cc Kenalog 40, 2 cc lidocaine, 2 cc bupivacaine injected easily into the suprapatellar recess. Completed without difficulty  Pain immediately resolved suggesting accurate  placement of the medication.  Advised to call if fevers/chills, erythema, induration, drainage, or persistent bleeding.  Images permanently stored and available for review in the ultrasound unit.  Impression: Technically successful ultrasound guided injection.  Procedure: Real-time Ultrasound Guided Injection of right knee Device: GE Logiq E  Verbal informed consent obtained.  Time-out conducted.  Noted no overlying erythema, induration, or other signs of local infection.  Skin prepped in a sterile fashion.  Local anesthesia: Topical Ethyl chloride.  With sterile technique and under real time ultrasound guidance: 1 cc Kenalog 40, 2 cc lidocaine, 2 cc bupivacaine injected easily into the suprapatellar recess. Completed without difficulty  Pain immediately resolved suggesting accurate placement of the medication.  Advised to call if fevers/chills, erythema, induration, drainage, or persistent bleeding.  Images permanently stored and available for review in the ultrasound unit.  Impression: Technically successful ultrasound guided injection.  Impression and Recommendations:    Primary osteoarthritis of both knees Bilateral knee injections today, previous injections were in January. Viscosupplementation has not been effective, she does need an arthroplasty. Return as needed, I have advised her to talk about weight loss medication and weight loss treatment with her PCP. Also adding some tramadol for her to use for breakthrough pain. Celebrex ineffective. Return to see me as needed. ___________________________________________ Alison [name], M.D., ABFM., CAQSM. Primary Care and Sports Medicine [facility]  Adjunct Instructor of Family Medicine  [employer]

## 2018-03-23 ENCOUNTER — Encounter: Payer: Self-pay | Admitting: Family Medicine

## 2018-03-23 ENCOUNTER — Other Ambulatory Visit: Payer: Self-pay | Admitting: Family Medicine

## 2018-03-23 ENCOUNTER — Ambulatory Visit (INDEPENDENT_AMBULATORY_CARE_PROVIDER_SITE_OTHER): Payer: Medicare Other | Admitting: Family Medicine

## 2018-03-23 VITALS — BP 138/72 | HR 68 | Ht 61.0 in | Wt 177.0 lb

## 2018-03-23 DIAGNOSIS — R7301 Impaired fasting glucose: Secondary | ICD-10-CM

## 2018-03-23 DIAGNOSIS — R635 Abnormal weight gain: Secondary | ICD-10-CM

## 2018-03-23 DIAGNOSIS — I10 Essential (primary) hypertension: Secondary | ICD-10-CM | POA: Diagnosis not present

## 2018-03-23 DIAGNOSIS — Z6833 Body mass index (BMI) 33.0-33.9, adult: Secondary | ICD-10-CM | POA: Diagnosis not present

## 2018-03-23 LAB — POCT GLYCOSYLATED HEMOGLOBIN (HGB A1C): Hemoglobin A1C: 5.8 % — AB (ref 4.0–5.6)

## 2018-03-23 MED ORDER — TOPIRAMATE 25 MG PO TABS: ORAL_TABLET | ORAL | 0 refills | Status: DC

## 2018-03-23 MED ORDER — LOSARTAN POTASSIUM-HCTZ 100-12.5 MG PO TABS: 1.0000 | ORAL_TABLET | Freq: Every day | ORAL | 3 refills | Status: DC

## 2018-03-23 NOTE — Progress Notes (Signed)
Discussed options.  We will start with topiramate.  Follow-up in 1 month.Subjective:    CC: BP and glucose.   HPI:  Hypertension- Pt denies chest pain, SOB, dizziness, or heart palpitations.  Taking meds as directed w/o problems.  Denies medication side effects.    Impaired fasting glucose-no increased thirst or urination. No symptoms consistent with hypoglycemia.  For bilateral knee osteoarthritis she is also here today to discuss weight loss.  She would 164 pounds about a year ago so she is gained about 13 pounds in the last year.  Really only eats one meal a day.  She said she did cut out soda about 2 months ago and is just drinking water.  When she does eat once a day she is usually eating a high carb meal mostly [place] food.  Past medical history, Surgical history, Family history not pertinant except as noted below, Social history, Allergies, and medications have been entered into the medical record, reviewed, and corrections made.   Review of Systems: No fevers, chills, night sweats, weight loss, chest pain, or shortness of breath.   Objective:    General: Well Developed, well nourished, and in no acute distress.  Neuro: Alert and oriented x3, extra-ocular muscles intact, sensation grossly intact.  HEENT: Normocephalic, atraumatic  Skin: Warm and dry, no rashes. Cardiac: Regular rate and rhythm, no murmurs rubs or gallops, no lower extremity edema.  Respiratory: Clear to auscultation bilaterally. Not using accessory muscles, speaking in full sentences.   Impression and Recommendations:    HTN - Well controlled. Continue current regimen. Follow up in  6 months.    IFG - Well controlled. Continue current regimen. Follow up in  6 monhts.  A1C of 5.8.   Abnormal weight gain/Overweight with comobordities - she is 177 lbs today.  Discussed strategies around this.  We also discussed medication options.  Because she is [age] I really want to avoid phentermine and because she is on  Medicare they will not cover any of the newer weight loss medications.  Current weight: 177 pounds Previous weight: 178 pounds: Dietary goals: She already drinks plenty of water so we discussed making sure that she is eating at least 3 meals a day and working on increasing protein content.  Discussed options and suggestions for which she could eat.  Right now she is only eating 1 meal per day.  I discussed setting dietary caloric goals with a smart phone app called lose it or my fitness pal. Exercise goal: Stretching for 10 minutes a day Medication: Discussed starting topiramate versus metformin.  Will opt for topiramate at this point start with 25 mg daily and go up to twice a day after 1 week.   Follow-up: 4 to 6 weeks.

## 2018-03-23 NOTE — Patient Instructions (Signed)
Download Lose It or My Fitness pal to set calorie goals.   Work on eating 3 times a day. Eat more protein!! Stretching for 10 minutes a day.  No diet soda.  You are doing a great job on this already.

## 2018-04-20 ENCOUNTER — Ambulatory Visit: Payer: Medicare Other | Admitting: Family Medicine

## 2018-06-02 ENCOUNTER — Other Ambulatory Visit: Payer: Self-pay | Admitting: Family Medicine

## 2018-06-02 DIAGNOSIS — Z1231 Encounter for screening mammogram for malignant neoplasm of breast: Secondary | ICD-10-CM

## 2018-06-10 ENCOUNTER — Ambulatory Visit: Payer: Medicare Other

## 2018-06-15 ENCOUNTER — Ambulatory Visit (INDEPENDENT_AMBULATORY_CARE_PROVIDER_SITE_OTHER): Payer: Medicare Other | Admitting: Sports Medicine

## 2018-06-15 ENCOUNTER — Encounter: Payer: Self-pay | Admitting: Sports Medicine

## 2018-06-15 DIAGNOSIS — M170 Bilateral primary osteoarthritis of knee: Secondary | ICD-10-CM

## 2018-06-15 NOTE — Progress Notes (Signed)
Subjective:    CC: Knee pain  HPI: Alison [name] returns, she is a pleasant 67 year old female.  We have been treating her for bilateral knee arthritis for some time now, she is failed NSAIDs, tramadol, rehabilitation exercises, weight loss treatment.  We did steroid injections and Visco supplementation, viscosupplementation was not effective, steroid injections only provide about a month of relief.  She has been resistant to the discussion of knee arthroplasty.  Continues to have disabling, lifestyle limiting pain.  I reviewed the past medical history, family history, social history, surgical history, and allergies today and no changes were needed.  Please see the problem list section below in epic for further details.  Past Medical History: Past Medical History:  Diagnosis Date  . Endometriosis    Past Surgical History: Past Surgical History:  Procedure Laterality Date  . ABDOMINAL HYSTERECTOMY     Social History: Social History   Socioeconomic History  . Marital status: Single    Spouse name: Not on file  . Number of children: Not on file  . Years of education: Not on file  . Highest education level: Not on file  Occupational History  . Not on file  Social Needs  . Financial resource strain: Not on file  . Food insecurity:    Worry: Not on file    Inability: Not on file  . Transportation needs:    Medical: Not on file    Non-medical: Not on file  Tobacco Use  . Smoking status: Never Smoker  . Smokeless tobacco: Never Used  Substance and Sexual Activity  . Alcohol use: Yes  . Drug use: No  . Sexual activity: Not on file    Comment: works FT, doesn't regularly exercise  Lifestyle  . Physical activity:    Days per week: Not on file    Minutes per session: Not on file  . Stress: Not on file  Relationships  . Social connections:    Talks on phone: Not on file    Gets together: Not on file    Attends religious service: Not on file    Active member of club or  organization: Not on file    Attends meetings of clubs or organizations: Not on file    Relationship status: Not on file  Other Topics Concern  . Not on file  Social History Narrative  . Not on file   Family History: Family History  Problem Relation Age of Onset  . Cancer Mother   . Hyperlipidemia Mother   . Hypertension Mother   . Alcohol abuse Father   . Cancer Unknown   . Heart attack Unknown   . Diabetes Unknown   . Stroke Unknown    Allergies: No Known Allergies Medications: See med rec.  Review of Systems: No fevers, chills, night sweats, weight loss, chest pain, or shortness of breath.   Objective:    General: Well Developed, well nourished, and in no acute distress.  Neuro: Alert and oriented x3, extra-ocular muscles intact, sensation grossly intact.  HEENT: Normocephalic, atraumatic, pupils equal round reactive to light, neck supple, no masses, no lymphadenopathy, thyroid nonpalpable.  Skin: Warm and dry, no rashes. Cardiac: Regular rate and rhythm, no murmurs rubs or gallops, no lower extremity edema.  Respiratory: Clear to auscultation bilaterally. Not using accessory muscles, speaking in full sentences.  Procedure: Real-time Ultrasound Guided Injection of Left knee  Device: GE Logiq E  Verbal informed consent obtained.  Time-out conducted.  Noted no overlying erythema, induration, or other  signs of local infection.  Skin prepped in a sterile fashion.  Local anesthesia: Topical Ethyl chloride.  With sterile technique and under real time ultrasound guidance: 1 cc Kenalog 40, 2 cc lidocaine, 2 cc bupivacaine injected easily into the suprapatellar recess.  Completed without difficulty  Pain immediately resolved suggesting accurate placement of the medication.  Advised to call if fevers/chills, erythema, induration, drainage, or persistent bleeding.  Images permanently stored and available for review in the ultrasound unit.  Impression: Technically successful  ultrasound guided injection.   Procedure: Real-time Ultrasound Guided Injection of right knee  Device: GE Logiq E  Verbal informed consent obtained.  Time-out conducted.  Noted no overlying erythema, induration, or other signs of local infection.  Skin prepped in a sterile fashion.  Local anesthesia: Topical Ethyl chloride.  With sterile technique and under real time ultrasound guidance: 1 cc Kenalog 40, 2 cc lidocaine, 2 cc bupivacaine injected easily into the suprapatellar recess.  Completed without difficulty  Pain immediately resolved suggesting accurate placement of the medication.  Advised to call if fevers/chills, erythema, induration, drainage, or persistent bleeding.  Images permanently stored and available for review in the ultrasound unit.  Impression: Technically successful ultrasound guided injection.  Impression and Recommendations:    Primary osteoarthritis of both knees Bilateral injections as above. Visco supplementation not effective, weight loss with PCP, Celebrex and effective, tramadol effective for breakthrough pain. Patient declined discussion about arthroplasty. Referral to feel Coolief radiofrequency ablation.  I spent 25 minutes with this patient, greater than 50% was face-to-face time counseling regarding the above diagnoses, we also discussed nonoperative measures, this was separate from the time spent performing the above procedures. ___________________________________________ [name]. [name], M.D., ABFM., CAQSM. Primary Care and Sports Medicine [facility]  Adjunct Instructor of Family Medicine  [employer]

## 2018-06-15 NOTE — Assessment & Plan Note (Signed)
Bilateral injections as above. Visco supplementation not effective, weight loss with PCP, Celebrex and effective, tramadol effective for breakthrough pain. Patient declined discussion about arthroplasty. Referral to feel Coolief radiofrequency ablation.

## 2018-06-23 ENCOUNTER — Ambulatory Visit (INDEPENDENT_AMBULATORY_CARE_PROVIDER_SITE_OTHER): Payer: Medicare Other | Admitting: Physician Assistant

## 2018-06-23 ENCOUNTER — Encounter: Payer: Self-pay | Admitting: Physician Assistant

## 2018-06-23 VITALS — BP 126/42 | HR 58 | Ht 61.0 in | Wt 176.0 lb

## 2018-06-23 DIAGNOSIS — N952 Postmenopausal atrophic vaginitis: Secondary | ICD-10-CM | POA: Diagnosis not present

## 2018-06-23 DIAGNOSIS — L0100 Impetigo, unspecified: Secondary | ICD-10-CM | POA: Diagnosis not present

## 2018-06-23 DIAGNOSIS — K130 Diseases of lips: Secondary | ICD-10-CM | POA: Diagnosis not present

## 2018-06-23 MED ORDER — ESTRADIOL 0.1 MG/GM VA CREA: 1.0000 | TOPICAL_CREAM | Freq: Every day | VAGINAL | 2 refills | Status: DC

## 2018-06-23 MED ORDER — MUPIROCIN CALCIUM 2 % EX CREA: 1.0000 "application " | TOPICAL_CREAM | Freq: Two times a day (BID) | CUTANEOUS | 0 refills | Status: DC

## 2018-06-23 NOTE — Patient Instructions (Signed)
Atrophic Vaginitis Atrophic vaginitis is when the tissues that line the vagina become dry and thin. This is caused by a drop in estrogen. Estrogen helps:  To keep the vagina moist.  To make a clear fluid that helps: ? To lubricate the vagina for sex. ? To protect the vagina from infection.  If the lining of the vagina is dry and thin, it may:  Make sex painful. It may also cause bleeding.  Cause a feeling of: ? Burning. ? Irritation. ? Itchiness.  Make an exam of your vagina painful. It may also cause bleeding.  Make you lose interest in sex.  Cause a burning feeling when you pee.  Make your vaginal fluid (discharge) brown or yellow.  For some women, there are no symptoms. This condition is most common in women who do not get their regular menstrual periods anymore (menopause). This often starts when a woman is 45-55 years old. Follow these instructions at home:  Take medicines only as told by your doctor. Do not use any herbal or alternative medicines unless your doctor says it is okay.  Use over-the-counter products for dryness only as told by your doctor. These include: ? Creams. ? Lubricants. ? Moisturizers.  Do not douche.  Do not use products that can make your vagina dry. These include: ? Scented feminine sprays. ? Scented tampons. ? Scented soaps.  If it hurts to have sex, tell your sexual partner. Contact a doctor if:  Your discharge looks different than normal.  Your vagina has an unusual smell.  You have new symptoms.  Your symptoms do not get better with treatment.  Your symptoms get worse. This information is not intended to replace advice given to you by your health care provider. Make sure you discuss any questions you have with your health care provider. Document Released: 03/11/2008 Document Revised: 02/29/2016 Document Reviewed: 09/14/2014 Elsevier Interactive Patient Education  2018 Elsevier Inc.  

## 2018-06-24 ENCOUNTER — Encounter: Payer: Self-pay | Admitting: Physician Assistant

## 2018-06-24 MED ORDER — ESTRADIOL 10 MCG VA TABS: ORAL_TABLET | VAGINAL | 5 refills | Status: DC

## 2018-06-24 MED ORDER — MUPIROCIN 2 % EX OINT: TOPICAL_OINTMENT | CUTANEOUS | 0 refills | Status: DC

## 2018-06-24 NOTE — Progress Notes (Signed)
   Subjective:    Patient ID: Alison [name], female    DOB: Sep 16, 1951, 67 y.o.   MRN: [id number]  HPI Pt is a very pleasant 67 yo female who presents to the clinic with an ulcer on her left lower lip and sore just under her nose. She got a lot of sun on labor day and burned bad. The sores came up right after the burn. She is concerned because they are not healing well and people keep telling her it is herpes. She is in a fairly new relationship. They are sexually active. She has only been using chapstick. It was doing better and then they had a make out session and seemed to get worse. He has no hx of or active lesion. No fever, chills, sinus pressure.   She also wonders what to do about vaginal dryness. She would like some help now that she is more sexually active.   .. Active Ambulatory Problems    Diagnosis Date Noted  . HYPERTENSION, BENIGN 05/30/2008  . GERD 05/30/2008  . Alcohol abuse 11/29/2013  . Depression 11/29/2013  . Primary osteoarthritis of both knees 09/09/2016  . IFG (impaired fasting glucose) 09/09/2016  . Duodenal bulb ulcer with perforation (HCC) 02/20/2017  . Elevated LFTs 04/22/2017  . Obesity (BMI 30.0-34.9) 07/29/2016  . Orthostasis 07/29/2016   Resolved Ambulatory Problems    Diagnosis Date Noted  . ALCOHOL USE 05/30/2008  . OSTEOARTHRITIS, KNEE 08/26/2008  . LEG PAIN, BILATERAL 06/14/2008  . CHEST PAIN, ACUTE 10/17/2008  . HTN (hypertension) 11/29/2013  . Gastroenteritis 07/29/2016   Past Medical History:  Diagnosis Date  . Endometriosis       Review of Systems    see HPI.  Objective:   Physical Exam  Constitutional: She is oriented to person, place, and time. She appears well-developed and well-nourished.  HENT:  Head:    Cardiovascular: Normal rate and regular rhythm.  Neurological: She is alert and oriented to person, place, and time.  Psychiatric: She has a normal mood and affect. Her behavior is normal.          Assessment &  Plan:  Marland Kitchen.Marland Kitchen.Diagnoses and all orders for this visit:  Lip ulceration -     Viral culture  Atrophic vaginitis -     Estradiol 10 MCG TABS vaginal tablet; Place one tablet vaginally at bedtime twice a week.  Impetigo -     mupirocin cream (BACTROBAN) 2 %; Apply 1 application topically 2 (two) times daily. For 10 days. -     mupirocin ointment (BACTROBAN) 2 %; Apply to affected area TID for 7 days.  Other orders -     Discontinue: estradiol (ESTRACE VAGINAL) 0.1 MG/GM vaginal cream; Place 1 Applicatorful vaginally at bedtime. For 2 week then decrease to lowest effect dose usually three times a week.   Will culture to rule out herpes. The crusting on the lesion under nose appears like imeptigo. bactroban given to use for next 5 days. Keep from biting area or providing more trauma. Avoid making out for lip to heal. Vaginal estrogen given for dryness. Use every day then decrease to 3 times a week.

## 2018-06-30 LAB — HERPES SIMPLEX VIRUS CULTURE W/RFLX TO TYPING
MICRO NUMBER:: 91125775
SPECIMEN QUALITY: ADEQUATE

## 2018-06-30 NOTE — Progress Notes (Signed)
No herpes detected.  How is lip lesion?

## 2018-08-03 DIAGNOSIS — Z79899 Other long term (current) drug therapy: Secondary | ICD-10-CM | POA: Diagnosis not present

## 2018-08-03 DIAGNOSIS — M170 Bilateral primary osteoarthritis of knee: Secondary | ICD-10-CM | POA: Diagnosis not present

## 2018-08-03 DIAGNOSIS — Z5181 Encounter for therapeutic drug level monitoring: Secondary | ICD-10-CM | POA: Diagnosis not present

## 2018-09-07 ENCOUNTER — Ambulatory Visit (INDEPENDENT_AMBULATORY_CARE_PROVIDER_SITE_OTHER): Payer: Medicare Other

## 2018-09-07 ENCOUNTER — Encounter: Payer: Self-pay | Admitting: Family Medicine

## 2018-09-07 ENCOUNTER — Ambulatory Visit (INDEPENDENT_AMBULATORY_CARE_PROVIDER_SITE_OTHER): Payer: Medicare Other | Admitting: Family Medicine

## 2018-09-07 VITALS — BP 165/72 | HR 64 | Wt 177.0 lb

## 2018-09-07 DIAGNOSIS — S63206A Unspecified subluxation of right little finger, initial encounter: Secondary | ICD-10-CM | POA: Diagnosis not present

## 2018-09-07 DIAGNOSIS — S63259A Unspecified dislocation of unspecified finger, initial encounter: Secondary | ICD-10-CM

## 2018-09-07 DIAGNOSIS — S6991XA Unspecified injury of right wrist, hand and finger(s), initial encounter: Secondary | ICD-10-CM | POA: Diagnosis not present

## 2018-09-07 DIAGNOSIS — S63296A Dislocation of distal interphalangeal joint of right little finger, initial encounter: Secondary | ICD-10-CM | POA: Diagnosis not present

## 2018-09-07 DIAGNOSIS — W11XXXA Fall on and from ladder, initial encounter: Secondary | ICD-10-CM

## 2018-09-07 NOTE — Progress Notes (Signed)
Alison [name] is a 67 y.o. female who [facility] to [facility] today for right hand injury.  Patient fell off a ladder on Saturday November 30th.  She injured her right hand.  She notes a right hand deformity where her fifth digit is displaced ulnarly.  She notes pain and swelling and bruising especially at the PIP.  She denies any numbness distally.  She is right-hand dominant and busy doing candle making.  She is tried some over-the-counter medicines for pain which only helped a little.    ROS:  As above  Exam:  BP (!) 165/72   Pulse 64   Wt 177 lb (80.3 kg)   BMI 33.44 kg/m  General: Well Developed, well nourished, and in no acute distress.  Neuro/Psych: Alert and oriented x3, extra-ocular muscles intact, able to move all 4 extremities, sensation grossly intact. Skin: Warm and dry, no rashes noted.  Respiratory: Not using accessory muscles, speaking in full sentences, trachea midline.  Cardiovascular: Pulses palpable, no extremity edema. Abdomen: Does not appear distended. MSK: Right hand: Displaced ulnar at PIP.  Swelling and ecchymosis present.  Sensation and capillary refill are intact distally.  Motion limited.    Lab and Radiology Results X-ray right fifth digit shows dorsal dislocation at PIP with tiny avulsion fracture. Images personally independently reviewed.  Procedure note: Reduction of finger dislocation Consent obtained and timeout performed. Palmer right fifth digit MCP cleaned with chlorhexidine and cold spray applied and 5 mL of a 50-50 mixture of lidocaine and Marcaine injected achieving a good digital block. Traction applied and finger reduced achieving normal anatomical alignment.  Patient placed in a dorsal AlumaFoam splint with mild flexion at PIP and a normal anatomical finger position. Patient tolerated the procedure well.    Assessment and Plan: 67 y.o. female with right fifth PIP dislocation.  Reduced in  clinic.  Patient already scheduled to follow-up with my sports medicine partner Dr. Benjamin Stainhekkekandam in 1 week.  Plan to follow this issue up then.  Patient deferred further x-rays today.  Stressed the importance of continued splinting.  Recheck sooner if needed otherwise follow-up in 1 week.  Global service charge used  Orders Placed This Encounter  Procedures  . DG Finger Little Right    Order Specific Question:   Reason for exam:    Answer:   FINGER INJURY    Order Specific Question:   Preferred imaging location?    [facility]   No orders of the defined types were placed in this encounter.   Historical information moved to improve visibility of documentation.  Past Medical History:  Diagnosis Date  . Endometriosis    Past Surgical History:  Procedure Laterality Date  . ABDOMINAL HYSTERECTOMY     Social History   Tobacco Use  . Smoking status: Never Smoker  . Smokeless tobacco: Never Used  Substance Use Topics  . Alcohol use: Yes   family history includes Alcohol abuse in her father; Cancer in her mother and unknown relative; Diabetes in her unknown relative; Heart attack in her unknown relative; Hyperlipidemia in her mother; Hypertension in her mother; Stroke in her unknown relative.  Medications: Current Outpatient Medications  Medication Sig Dispense Refill  . diclofenac sodium (VOLTAREN) 1 % GEL APPLY 2 GRAMS TO AFFECTED AREA(S) FOUR TIMES DAILY 100 g 11  . esomeprazole (NEXIUM) 20 MG capsule Take by mouth.    . Estradiol 10 MCG TABS vaginal tablet Place one tablet vaginally  at bedtime twice a week. 8 tablet 5  . losartan-hydrochlorothiazide (HYZAAR) 100-12.5 MG tablet Take 1 tablet by mouth daily. 90 tablet 3  . metoprolol succinate (TOPROL-XL) 100 MG 24 hr tablet TAKE 1 TABLET BY MOUTH ONCE DAILY 90 tablet 1  . mupirocin cream (BACTROBAN) 2 % Apply 1 application topically 2 (two) times daily. For 10 days. 15 g 0  . mupirocin ointment (BACTROBAN) 2  % Apply to affected area TID for 7 days. 30 g 0  . traMADol (ULTRAM) 50 MG tablet Take 1 tablet (50 mg total) by mouth every 8 (eight) hours as needed for moderate pain. Maximum 6 tabs per day. 21 tablet 0   No current facility-administered medications for this visit.    No Known Allergies    Discussed warning signs or symptoms. Please see discharge instructions. Patient expresses understanding.

## 2018-09-07 NOTE — Patient Instructions (Signed)
Thank you for coming in today. Continue the splint.  Keep the splint on if you can.   Follow up with Dr T as scheduled next week.   Return sooner if needed.    Finger or Thumb Dislocation Finger or thumb dislocation happens when two bones in your finger or thumb separate at a joint. This happens because of a tear in the connective tissue (ligaments) that connect one bone to another. Dislocation can happen between two bones in the finger or thumb (phalanges). It can also happen between a finger or thumb bone and a bone in your hand (metacarpal). What are the causes? This condition is caused by a forceful impact or injury to the hand, such as when the finger or thumb bends the wrong way (hyperextension). What increases the risk? This condition is more likely to develop in:  People who have injured their hand in the past.  People who do repetitive motions with their hands, such as movements in sports or heavy labor.  People who have poor hand strength and flexibility.  What are the signs or symptoms? Symptoms of this condition may include:  Deformity of the injured area. The affected joint may look like it is out of place or at an odd angle.  Pain, swelling, and bruising in the injured area.  Limited range of motion of the finger or thumb.  How is this diagnosed? This condition is diagnosed with a physical exam. You may have X-rays to check for breaks (fractures) in your bones. How is this treated? For mild dislocations, this condition is treated by moving your finger or thumb back into position (reduction). Your health care provider may do this by hand (manually) or with surgery. You may need surgical reduction if you have:  A severe dislocation.  A dislocation that cannot be reduced manually.  A fractured bone.  An open wound.  After reduction, your finger or thumb may be kept in a fixed position (immobilized) with a splint for up to 6 weeks. You may also need physical  therapy. In some cases, you may be given the name of a health care provider who specializes in bone disorders (orthopedist) to help treat your condition. Follow these instructions at home: If you have a splint:  Do not put pressure on any part of the splint until it is fully hardened. This may take several hours.  Wear it as told by your health care provider. Remove it only as told by your health care provider.  Loosen the splint if your fingers tingle, become numb, or turn cold and blue.  Do not let your splint get wet if it is not waterproof. ? Do not take baths, swim, or use a hot tub until your health care provider approves. Ask your health care provider if you can take showers. ? If you have a splint that is not waterproof, cover it with a watertight plastic bag when you take a bath or a shower.  Keep the splint clean. Managing pain, stiffness, and swelling  If directed, put ice on the injured area. ? Put ice in a plastic bag. ? Place a towel between your skin and the bag. ? Leave the ice on for 20 minutes, 2-3 times a day.  Move your fingers often to avoid stiffness and to lessen swelling.  Raise (elevate) the injured area above the level of your heart while you are sitting or lying down. Driving  Do not drive or operate heavy machinery while taking prescription pain  medicine.  Ask your health care provider when it is safe to drive if you have a splint on your hand. Activity  Return to your normal activities as told by your health care provider. Ask your health care provider what activities are safe for you.  Rest and limit your hand movement as told by your health care provider.  If physical therapy was prescribed, do exercises as told by your health care provider. General instructions  Take over-the-counter and prescription medicines only as told by your health care provider.  Do not use any tobacco products, such as cigarettes, chewing tobacco, and e-cigarettes.  Tobacco can delay bone healing. If you need help quitting, ask your health care provider.  Keep all follow-up visits as told by your health care provider. This is important. Contact a health care provider if:  You have problems with your splint.  You have pain that gets worse or does not get better with medicine.  You have more bruising, swelling, or redness in your injured area.  You have difficulty moving your finger or thumb after it heals. Get help right away if:  You develop numbness in your finger or thumb.  You cannot move your finger or thumb.  Your finger or thumb is pale or cold.  You have severe pain. This information is not intended to replace advice given to you by your health care provider. Make sure you discuss any questions you have with your health care provider. Document Released: 09/20/2000 Document Revised: 02/29/2016 Document Reviewed: 02/17/2015 Elsevier Interactive Patient Education  [employer]2018 [employer]

## 2018-09-14 ENCOUNTER — Encounter: Payer: Self-pay | Admitting: Sports Medicine

## 2018-09-14 ENCOUNTER — Other Ambulatory Visit: Payer: Self-pay | Admitting: *Deleted

## 2018-09-14 ENCOUNTER — Ambulatory Visit (INDEPENDENT_AMBULATORY_CARE_PROVIDER_SITE_OTHER): Payer: Medicare Other | Admitting: Sports Medicine

## 2018-09-14 DIAGNOSIS — M170 Bilateral primary osteoarthritis of knee: Secondary | ICD-10-CM

## 2018-09-14 DIAGNOSIS — S63289D Dislocation of proximal interphalangeal joint of unspecified finger, subsequent encounter: Secondary | ICD-10-CM

## 2018-09-14 DIAGNOSIS — S63289A Dislocation of proximal interphalangeal joint of unspecified finger, initial encounter: Secondary | ICD-10-CM | POA: Insufficient documentation

## 2018-09-14 MED ORDER — METOPROLOL SUCCINATE ER 100 MG PO TB24: ORAL_TABLET | ORAL | 1 refills | Status: DC

## 2018-09-14 NOTE — Progress Notes (Signed)
Subjective:    CC: Finger, knees  HPI: Raaga returns, she is a pleasant 67 year old female with bilateral knee osteoarthritis, previous injection was 3 months ago.  Having a recurrence of pain, moderate, persistent without radiation.  She did have her initial block in anticipation of Coolief radiofrequency ablation, she got 6 hours of near complete relief.  She has not yet been contacted to proceed with the definitive radiofrequency ablation.  I reviewed the past medical history, family history, social history, surgical history, and allergies today and no changes were needed.  Please see the problem list section below in epic for further details.  Past Medical History: Past Medical History:  Diagnosis Date  . Endometriosis    Past Surgical History: Past Surgical History:  Procedure Laterality Date  . ABDOMINAL HYSTERECTOMY     Social History: Social History   Socioeconomic History  . Marital status: Single    Spouse name: Not on file  . Number of children: Not on file  . Years of education: Not on file  . Highest education level: Not on file  Occupational History  . Not on file  Social Needs  . Financial resource strain: Not on file  . Food insecurity:    Worry: Not on file    Inability: Not on file  . Transportation needs:    Medical: Not on file    Non-medical: Not on file  Tobacco Use  . Smoking status: Never Smoker  . Smokeless tobacco: Never Used  Substance and Sexual Activity  . Alcohol use: Yes  . Drug use: No  . Sexual activity: Not on file    Comment: works FT, doesn't regularly exercise  Lifestyle  . Physical activity:    Days per week: Not on file    Minutes per session: Not on file  . Stress: Not on file  Relationships  . Social connections:    Talks on phone: Not on file    Gets together: Not on file    Attends religious service: Not on file    Active member of club or organization: Not on file    Attends meetings of clubs or organizations: Not  on file    Relationship status: Not on file  Other Topics Concern  . Not on file  Social History Narrative  . Not on file   Family History: Family History  Problem Relation Age of Onset  . Cancer Mother   . Hyperlipidemia Mother   . Hypertension Mother   . Alcohol abuse Father   . Cancer Unknown   . Heart attack Unknown   . Diabetes Unknown   . Stroke Unknown    Allergies: No Known Allergies Medications: See med rec.  Review of Systems: No fevers, chills, night sweats, weight loss, chest pain, or shortness of breath.   Objective:    General: Well Developed, well nourished, and in no acute distress.  Neuro: Alert and oriented x3, extra-ocular muscles intact, sensation grossly intact.  HEENT: Normocephalic, atraumatic, pupils equal round reactive to light, neck supple, no masses, no lymphadenopathy, thyroid nonpalpable.  Skin: Warm and dry, no rashes. Cardiac: Regular rate and rhythm, no murmurs rubs or gallops, no lower extremity edema.  Respiratory: Clear to auscultation bilaterally. Not using accessory muscles, speaking in full sentences. Bilateral knees: Normal to inspection with no erythema or effusion or obvious bony abnormalities. Tender to palpation at the medial joint line bilaterally. ROM normal in flexion and extension and lower leg rotation. Ligaments with solid consistent endpoints including  ACL, PCL, LCL, MCL. Negative Mcmurray's and provocative meniscal tests. Non painful patellar compression. Patellar and quadriceps tendons unremarkable. Hamstring and quadriceps strength is normal.  Procedure: Real-time Ultrasound Guided Injection of right knee Device: GE Logiq E  Verbal informed consent obtained.  Time-out conducted.  Noted no overlying erythema, induration, or other signs of local infection.  Skin prepped in a sterile fashion.  Local anesthesia: Topical Ethyl chloride.  With sterile technique and under real time ultrasound guidance: 1 cc Kenalog 40, 2  cc lidocaine, 2 cc bupivacaine injected easily Completed without difficulty  Pain immediately resolved suggesting accurate placement of the medication.  Advised to call if fevers/chills, erythema, induration, drainage, or persistent bleeding.  Images permanently stored and available for review in the ultrasound unit.  Impression: Technically successful ultrasound guided injection.  Procedure: Real-time Ultrasound Guided Injection of left knee Device: GE Logiq E  Verbal informed consent obtained.  Time-out conducted.  Noted no overlying erythema, induration, or other signs of local infection.  Skin prepped in a sterile fashion.  Local anesthesia: Topical Ethyl chloride.  With sterile technique and under real time ultrasound guidance: 1 cc Kenalog 40, 2 cc lidocaine, 2 cc bupivacaine injected easily Completed without difficulty  Pain immediately resolved suggesting accurate placement of the medication.  Advised to call if fevers/chills, erythema, induration, drainage, or persistent bleeding.  Images permanently stored and available for review in the ultrasound unit.  Impression: Technically successful ultrasound guided injection.  Impression and Recommendations:    Primary osteoarthritis of both knees Bilateral knee injection, previous injection was 3 months ago. She did have the blocks in anticipation of Coolief radiofrequency ablation but has not yet been contacted regarding the definitive procedure.  Dislocation of finger PIP joint Post closed reduction with Dr. Denyse Amassorey. Buddy tape fingers today, further management per Dr. Denyse Amassorey. __________________________________________ Ihor Austinhomas J. Benjamin Stainhekkekandam, M.D., ABFM., CAQSM. Primary Care and Sports Medicine [facility]  Adjunct Professor of Family Medicine  [employer]

## 2018-09-14 NOTE — Assessment & Plan Note (Signed)
Bilateral knee injection, previous injection was 3 months ago. She did have the blocks in anticipation of Coolief radiofrequency ablation but has not yet been contacted regarding the definitive procedure.

## 2018-09-14 NOTE — Assessment & Plan Note (Signed)
Post closed reduction with Dr. Denyse Amassorey. Buddy tape fingers today, further management per Dr. Denyse Amassorey.

## 2018-12-14 ENCOUNTER — Ambulatory Visit: Payer: Medicare Other | Admitting: Sports Medicine

## 2019-01-13 ENCOUNTER — Encounter: Payer: Self-pay | Admitting: *Deleted

## 2019-01-13 ENCOUNTER — Other Ambulatory Visit: Payer: Self-pay | Admitting: *Deleted

## 2019-01-13 DIAGNOSIS — Z1231 Encounter for screening mammogram for malignant neoplasm of breast: Secondary | ICD-10-CM

## 2019-01-25 DIAGNOSIS — M170 Bilateral primary osteoarthritis of knee: Secondary | ICD-10-CM | POA: Diagnosis not present

## 2019-01-25 DIAGNOSIS — I951 Orthostatic hypotension: Secondary | ICD-10-CM | POA: Diagnosis not present

## 2019-02-01 DIAGNOSIS — M25562 Pain in left knee: Secondary | ICD-10-CM | POA: Diagnosis not present

## 2019-02-01 DIAGNOSIS — M170 Bilateral primary osteoarthritis of knee: Secondary | ICD-10-CM | POA: Diagnosis not present

## 2019-02-15 DIAGNOSIS — G8929 Other chronic pain: Secondary | ICD-10-CM | POA: Diagnosis not present

## 2019-02-15 DIAGNOSIS — M1711 Unilateral primary osteoarthritis, right knee: Secondary | ICD-10-CM | POA: Diagnosis not present

## 2019-02-15 DIAGNOSIS — M25561 Pain in right knee: Secondary | ICD-10-CM | POA: Diagnosis not present

## 2019-03-10 ENCOUNTER — Other Ambulatory Visit: Payer: Self-pay | Admitting: Family Medicine

## 2019-03-16 ENCOUNTER — Ambulatory Visit (INDEPENDENT_AMBULATORY_CARE_PROVIDER_SITE_OTHER): Payer: Medicare Other | Admitting: Sports Medicine

## 2019-03-16 ENCOUNTER — Ambulatory Visit (INDEPENDENT_AMBULATORY_CARE_PROVIDER_SITE_OTHER): Payer: Medicare Other

## 2019-03-16 ENCOUNTER — Other Ambulatory Visit: Payer: Self-pay

## 2019-03-16 ENCOUNTER — Encounter: Payer: Self-pay | Admitting: Sports Medicine

## 2019-03-16 DIAGNOSIS — M48061 Spinal stenosis, lumbar region without neurogenic claudication: Secondary | ICD-10-CM

## 2019-03-16 DIAGNOSIS — M170 Bilateral primary osteoarthritis of knee: Secondary | ICD-10-CM

## 2019-03-16 DIAGNOSIS — R2242 Localized swelling, mass and lump, left lower limb: Secondary | ICD-10-CM | POA: Diagnosis not present

## 2019-03-16 DIAGNOSIS — M19072 Primary osteoarthritis, left ankle and foot: Secondary | ICD-10-CM | POA: Diagnosis not present

## 2019-03-16 NOTE — Assessment & Plan Note (Signed)
I do think her left leg weakness and pain is due to central canal stenosis with left-sided radiculopathy. I would like x-rays of her left ankle, lumbar spine, formal PT for her lumbar spine with focus on gait training as well. We can certainly add gabapentin if she does not improve. Alison [name] did decline compression hose.

## 2019-03-16 NOTE — Progress Notes (Signed)
Subjective:    CC: Left leg symptoms  HPI: Kathie RhodesBetty is a pleasant 68 year old female, for the past several months she has had swelling in her left leg, very mild, she feels as though she has less control over her left leg as well, and that sometimes she drags it behind.  No bowel or bladder dysfunction, saddle numbness, constitutional symptoms, no progressive weakness, no back pain.  I reviewed the past medical history, family history, social history, surgical history, and allergies today and no changes were needed.  Please see the problem list section below in epic for further details.  Past Medical History: Past Medical History:  Diagnosis Date  . Endometriosis    Past Surgical History: Past Surgical History:  Procedure Laterality Date  . ABDOMINAL HYSTERECTOMY     Social History: Social History   Socioeconomic History  . Marital status: Single    Spouse name: Not on file  . Number of children: Not on file  . Years of education: Not on file  . Highest education level: Not on file  Occupational History  . Not on file  Social Needs  . Financial resource strain: Not on file  . Food insecurity:    Worry: Not on file    Inability: Not on file  . Transportation needs:    Medical: Not on file    Non-medical: Not on file  Tobacco Use  . Smoking status: Never Smoker  . Smokeless tobacco: Never Used  Substance and Sexual Activity  . Alcohol use: Yes  . Drug use: No  . Sexual activity: Not on file    Comment: works FT, doesn't regularly exercise  Lifestyle  . Physical activity:    Days per week: Not on file    Minutes per session: Not on file  . Stress: Not on file  Relationships  . Social connections:    Talks on phone: Not on file    Gets together: Not on file    Attends religious service: Not on file    Active member of club or organization: Not on file    Attends meetings of clubs or organizations: Not on file    Relationship status: Not on file  Other Topics  Concern  . Not on file  Social History Narrative  . Not on file   Family History: Family History  Problem Relation Age of Onset  . Cancer Mother   . Hyperlipidemia Mother   . Hypertension Mother   . Alcohol abuse Father   . Cancer Unknown   . Heart attack Unknown   . Diabetes Unknown   . Stroke Unknown    Allergies: No Known Allergies Medications: See med rec.  Review of Systems: No fevers, chills, night sweats, weight loss, chest pain, or shortness of breath.   Objective:    General: Well Developed, well nourished, and in no acute distress.  Neuro: Alert and oriented x3, extra-ocular muscles intact, sensation grossly intact.  HEENT: Normocephalic, atraumatic, pupils equal round reactive to light, neck supple, no masses, no lymphadenopathy, thyroid nonpalpable.  Skin: Warm and dry, no rashes. Cardiac: Regular rate and rhythm, no murmurs rubs or gallops, no lower extremity edema.  Respiratory: Clear to auscultation bilaterally. Not using accessory muscles, speaking in full sentences. Back Exam:  Inspection: Unremarkable  Motion: Flexion 45 deg, Extension 45 deg, Side Bending to 45 deg bilaterally,  Rotation to 45 deg bilaterally  SLR laying: Negative  XSLR laying: Negative  Palpable tenderness: None. FABER: negative. Sensory change: Gross sensation  intact to all lumbar and sacral dermatomes.  Reflexes: 2+ at both patellar tendons, 2+ at achilles tendons, Babinski's downgoing.  Strength at foot  Plantar-flexion: 5/5 Dorsi-flexion: 5/5 Eversion: 5/5 Inversion: 5/5  Leg strength  Quad: 5/5 Hamstring: 5/5 Hip flexor: 5/5 Hip abductors: 5/5  Gait unremarkable. Only a tiny bit of fullness in the left lower leg, negative Homans sign  Impression and Recommendations:    Primary osteoarthritis of both knees post genicular RFA Nearly pain-free now post bilateral genicular radiofrequency ablation.  Lumbar spinal stenosis I do think her left leg weakness and pain is due to  central canal stenosis with left-sided radiculopathy. I would like x-rays of her left ankle, lumbar spine, formal PT for her lumbar spine with focus on gait training as well. We can certainly add gabapentin if she does not improve. Jacqulin did decline compression hose.   ___________________________________________ Gwen Her. Dianah Field, M.D., ABFM., CAQSM. Primary Care and Sports Medicine [facility]  Adjunct Professor of [employer]

## 2019-03-16 NOTE — Assessment & Plan Note (Signed)
Nearly pain-free now post bilateral genicular radiofrequency ablation.

## 2019-03-29 ENCOUNTER — Ambulatory Visit (INDEPENDENT_AMBULATORY_CARE_PROVIDER_SITE_OTHER): Payer: Medicare Other | Admitting: Physical Therapy

## 2019-03-29 ENCOUNTER — Encounter: Payer: Self-pay | Admitting: Physical Therapy

## 2019-03-29 ENCOUNTER — Other Ambulatory Visit: Payer: Self-pay

## 2019-03-29 DIAGNOSIS — R2689 Other abnormalities of gait and mobility: Secondary | ICD-10-CM

## 2019-03-29 DIAGNOSIS — R2681 Unsteadiness on feet: Secondary | ICD-10-CM | POA: Diagnosis not present

## 2019-03-29 NOTE — Patient Instructions (Signed)
Access Code: [email or web address]  URL: [email or web address]  Date: 03/29/2019  Prepared by: Faustino Congress   Exercises  Supine Bridge - 10 reps - 1 sets - 5 sec hold - 2x daily - 7x weekly  Sidelying Hip Abduction - 1 sets - 10 reps - 2x daily - 7x weekly  Supine Active Straight Leg Raise - 10 reps - 1 sets - 2x daily - 7x weekly  Sit to Stand - 10 reps - 1 sets - 2x daily - 7x weekly  Seated Piriformis Stretch - 3 reps - 1 sets - 30 sec hold - 1x daily - 7x weekly

## 2019-03-29 NOTE — Therapy (Signed)
[place], [place], [address] Phone: [phone]   Fax:  [phone]  Physical Therapy Evaluation  Patient Details  Name: Alison [name] MRN: [id number] Date of Birth: April 11, 1951 Referring Provider (PT): Silverio Decamp, MD   Encounter Date: 03/29/2019  PT End of Session - 03/29/19 0823    Visit Number  1    Number of Visits  12    Date for PT Re-Evaluation  05/10/19    PT Start Time  0731    PT Stop Time  0808    PT Time Calculation (min)  37 min    Activity Tolerance  Patient tolerated treatment well    Behavior During Therapy  [facility] for tasks assessed/performed       Past Medical History:  Diagnosis Date  . Endometriosis     Past Surgical History:  Procedure Laterality Date  . ABDOMINAL HYSTERECTOMY      There were no vitals filed for this visit.   Subjective Assessment - 03/29/19 0733    Subjective  Pt is a 68 y/o female who presents to OPPT for LLE swelling and pain, and following imaging determined she has "pinched nerve" and spinal stenosis.  Pt reports symptoms are chronic for years.    Pertinent History  severe OA in bil patella s/p radiofrequency ablation    Diagnostic tests  xrays: Diffuse degenerative disc and facet disease.    Patient Stated Goals  walk better    Currently in Pain?  No/denies         [facility] PT Assessment - 03/29/19 0739      Assessment   Medical Diagnosis  M48.061 (ICD-10-CM) - Spinal stenosis of lumbar region without neurogenic claudication    Referring Provider (PT)  Silverio Decamp, MD    Onset Date/Surgical Date  --   chronic x years   Hand Dominance  Right    Next MD Visit  04/12/2019    Prior Therapy  n/a      Precautions   Precautions  None      Restrictions   Weight Bearing Restrictions  No      Balance Screen   Has the patient fallen in the past 6 months  No    Has the patient had a decrease in activity level because of a  fear of falling?   Yes    Is the patient reluctant to leave their home because of a fear of falling?   No      Home Film/video editor residence    Living Arrangements  Alone    Additional Comments  no stairs to navigate      Prior Function   Level of [place]  Full time employment    Tour manager - owner    Leisure  work, church; no regular exercise      Cognition   Overall Cognitive Status  Within Functional Limits for tasks assessed      Observation/Other Assessments   Focus on Therapeutic Outcomes (FOTO)   not completed      Posture/Postural Control   Posture/Postural Control  Postural limitations    Postural Limitations  Rounded Shoulders;Forward head      ROM / Strength   AROM / PROM / Strength  AROM;Strength      AROM   Overall AROM Comments  Lt quadrant limited with pain  AROM Assessment Site  Lumbar    Lumbar Flexion  WNL    Lumbar Extension  WNL    Lumbar - Right Side Bend  limited 25% with pain on Rt side    Lumbar - Left Side Bend  WNL      Strength   Strength Assessment Site  Hip;Knee;Ankle    Right/Left Hip  Right;Left    Right Hip Flexion  5/5    Right Hip Extension  5/5    Right Hip ABduction  4/5    Left Hip Flexion  4/5    Left Hip Extension  3+/5    Left Hip ABduction  3/5    Right/Left Knee  Right;Left    Right Knee Flexion  5/5    Right Knee Extension  5/5    Left Knee Flexion  4/5    Left Knee Extension  4/5    Right/Left Ankle  Right;Left    Right Ankle Dorsiflexion  5/5    Left Ankle Dorsiflexion  4/5      Flexibility   Soft Tissue Assessment /Muscle Length  yes    Piriformis  tightness Lt > Rt      Special Tests    Special Tests  Lumbar;Leg LengthTest    Lumbar Tests  Straight Leg Raise    Leg length test   True      Straight Leg Raise   Findings  Negative      True   Length  cm    Right  80.5 in.    Left   77.8 in.       Ambulation/Gait   Gait Pattern  Lateral trunk lean to right;Trendelenburg;Decreased hip/knee flexion - left;Poor foot clearance - left   lateral lean in Rt stance               Objective measurements completed on examination: See above findings.      [facility] Adult PT Treatment/Exercise - 03/29/19 0739      Exercises   Exercises  Lumbar      Lumbar Exercises: Stretches   Figure 4 Stretch  2 reps;30 seconds;With overpressure;Seated      Lumbar Exercises: Seated   Sit to Stand  5 reps    Sit to Stand Limitations  cues for increased LLE weight bearing      Lumbar Exercises: Supine   Bridge  5 reps;5 seconds    Straight Leg Raise  5 reps;5 seconds   LLE     Lumbar Exercises: Sidelying   Hip Abduction  Left;5 reps             PT Education - 03/29/19 0823    Education Details  HEP    Person(s) Educated  Patient    Methods  Explanation;Demonstration;Handout    Comprehension  Verbalized understanding;Returned demonstration;Need further instruction          PT Long Term Goals - 03/29/19 0831      PT LONG TERM GOAL #1   Title  independent with HEP    Status  New    Target Date  05/10/19      PT LONG TERM GOAL #2   Title  report ability to amb without stumbles or catching of foot for at least 10 min for improved strength and function    Status  New    Target Date  05/10/19      PT LONG TERM GOAL #3   Title  demonstrate improved gait pattern without pain for improved  function    Status  New    Target Date  05/10/19             Plan - 03/29/19 0824    Clinical Impression Statement  Pt is a 68 y/o female who presents to OPPT for LLE weakness and other symptoms affecting functional mobility.  Pt demonstrates gait abnormalities, decreased flexibility, decreased strength and leg length discrepancy affecting functional mobility.  Heel lift provided today, but pt wearing sandals so unable to assess for change.  Will benefit from PT to address deficits  listed.    Personal Factors and Comorbidities  Comorbidity 3+    Comorbidities  HTN, OA, obesity, depression    Examination-Activity Limitations  Locomotion Level    Examination-Participation Restrictions  Shop;Other   working   Stability/Clinical Decision Making  Evolving/Moderate complexity    Clinical Decision Making  Moderate    Rehab Potential  Good    PT Frequency  2x / week    PT Duration  6 weeks    PT Treatment/Interventions  ADLs/Self Care Home Management;Cryotherapy;Electrical Stimulation;Moist Heat;Traction;Therapeutic exercise;Therapeutic activities;Functional mobility training;Stair training;Gait training;Ultrasound;Neuromuscular re-education;Patient/family education;Manual techniques;Taping;Dry needling    PT Next Visit Plan  review HEP, progress as able, assess heel lift if able (pt does not want to wear a mask - may need to tx with functional activities outside if willing or consider telehealth which was discussed today but not interested at this time)    PT Home Exercise Plan  Access Code: [email or web address]    Consulted and Agree with Plan of Care  Patient       Patient will benefit from skilled therapeutic intervention in order to improve the following deficits and impairments:  Abnormal gait, Decreased strength, Pain, Difficulty walking, Decreased mobility, Decreased range of motion, Impaired flexibility, Postural dysfunction  Visit Diagnosis: 1. Other abnormalities of gait and mobility   2. Unsteadiness on feet        Problem List Patient Active Problem List   Diagnosis Date Noted  . Lumbar spinal stenosis 03/16/2019  . Dislocation of finger PIP joint 09/14/2018  . Elevated LFTs 04/22/2017  . Duodenal bulb ulcer with perforation (HCC) 02/20/2017  . 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 IFG (impaired fasting glucose) 09/09/2016  . Obesity (BMI 30.0-34.9) 07/29/2016  . Orthostasis 07/29/2016  . Alcohol abuse 11/29/2013  . Depression  11/29/2013  . HYPERTENSION, BENIGN 05/30/2008  . GERD 05/30/2008      Clarita CraneStephanie F Vince Ainsley, PT, DPT 03/29/19 8:34 AM    [facility] [address] [place], [place], [address] Phone: [phone]   Fax:  [phone]  Name: Alison [name] MRN: [id number] Date of Birth: 10-12-1950

## 2019-03-31 ENCOUNTER — Other Ambulatory Visit: Payer: Self-pay

## 2019-03-31 ENCOUNTER — Ambulatory Visit (INDEPENDENT_AMBULATORY_CARE_PROVIDER_SITE_OTHER): Payer: Medicare Other | Admitting: Physical Therapy

## 2019-03-31 ENCOUNTER — Encounter: Payer: Self-pay | Admitting: Physical Therapy

## 2019-03-31 DIAGNOSIS — R2689 Other abnormalities of gait and mobility: Secondary | ICD-10-CM | POA: Diagnosis not present

## 2019-03-31 DIAGNOSIS — R2681 Unsteadiness on feet: Secondary | ICD-10-CM | POA: Diagnosis not present

## 2019-03-31 NOTE — Patient Instructions (Signed)
Access Code: [email or web address]  URL: [email or web address]  Date: 03/31/2019  Prepared by: Kerin Perna   Exercises  Supine Bridge - 5 reps - 2 sets - 1x daily - 7x weekly  Sit to Stand - 10 reps - 1 sets - 2x daily - 7x weekly  Gastroc Stretch on Wall - 2 reps - 1 sets - 20 seconds hold - 2x daily - 7x weekly  Supine Piriformis Stretch with Foot on Ground - 2 reps - 1 sets - 1x daily - 7x weekly  Standing Hip Abduction with Counter Support - 10 reps - 3 sets - 1x daily - 7x weekly  Standing March with Counter Support - 10 reps - 3 sets - 1x daily - 7x weekly

## 2019-03-31 NOTE — Therapy (Addendum)
[place], [place], [address] Phone: [phone]   Fax:  [phone]  Physical Therapy Treatment/Discharge  Patient Details  Name: Alison [name] MRN: [id number] Date of Birth: 11/13/1950 Referring Provider (PT): Silverio Decamp, MD   Encounter Date: 03/31/2019  PT End of Session - 03/31/19 0819    Visit Number  2    Number of Visits  12    Date for PT Re-Evaluation  05/10/19    PT Start Time  0803    PT Stop Time  0844    PT Time Calculation (min)  41 min    Activity Tolerance  Patient tolerated treatment well    Behavior During Therapy  [facility] for tasks assessed/performed       Past Medical History:  Diagnosis Date  . Endometriosis     Past Surgical History:  Procedure Laterality Date  . ABDOMINAL HYSTERECTOMY      There were no vitals filed for this visit.  Subjective Assessment - 03/31/19 0804    Subjective  Pt reports she hasn't had time to do the exercises; She has been busy with work.  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    Patient Stated Goals  walk better    Currently in Pain?  Yes    Pain Score  5     Pain Location  Knee    Pain Orientation  Left;Right    Pain Descriptors / Indicators  Aching    Aggravating Factors   being really tired.    Pain Relieving Factors  sleep         [facility] PT Assessment - 03/31/19 0001      Assessment   Medical Diagnosis  M48.061 (ICD-10-CM) - Spinal stenosis of lumbar region without neurogenic claudication    Referring Provider (PT)  Silverio Decamp, MD    Onset Date/Surgical Date  --   chronic x years   Hand Dominance  Right    Next MD Visit  04/12/2019    Prior Therapy  n/a       [facility] Adult PT Treatment/Exercise - 03/31/19 0001      Lumbar Exercises: Stretches   Passive Hamstring Stretch  --    Hip Flexor Stretch  --    Quad Stretch  Right;Left;2 reps;20 seconds   seated foot under chair   Piriformis Stretch  Right;Left;1 rep;20 seconds   towel assist behind knee   Figure 4 Stretch  1 rep;20 seconds   seated; limited tolerance on LLE     Lumbar Exercises: Aerobic   Nustep  L4: arms/legs x 6 min    PTA present to discuss progress     Lumbar Exercises: Seated   Sit to Stand  10 reps   eccentric lowering   Sit to Stand Limitations  cues for knee flexion      Lumbar Exercises: Supine   Bridge  10 reps;1 second    Straight Leg Raise  5 reps;1 second      Lumbar Exercises: Sidelying   Hip Abduction  Left;5 reps      Knee/Hip Exercises: Stretches   Passive Hamstring Stretch  Right;Left;1 rep;20 seconds   seated   Quad Stretch  Right;Left;2 reps;20 seconds   seated, foot under chair  Hip Flexor Stretch  Right;Left;2 reps;20 seconds   seated   Gastroc Stretch  Right;Left;2 reps;20 seconds   runners stretch     Knee/Hip Exercises: Standing   Hip Flexion  Stengthening;Right;Left;1 set;5 reps   with UE support on counter.    Hip Flexion Limitations  heavy lean into UE to avoid trunk lean    Hip Abduction  Left;Right;2 sets;5 reps      Knee/Hip Exercises: Supine   Straight Leg Raises  Strengthening;Left;1 set;5 reps             PT Education - 03/31/19 0850    Education Details  updated HEP    Person(s) Educated  Patient    Methods  Explanation;Demonstration;Tactile cues;Verbal cues;Handout    Comprehension  Returned demonstration;Verbalized understanding          PT Long Term Goals - 03/29/19 0831      PT LONG TERM GOAL #1   Title  independent with HEP    Status  New    Target Date  05/10/19      PT LONG TERM GOAL #2   Title  report ability to amb without stumbles or catching of foot for at least 10 min for improved strength and function    Status  New    Target Date  05/10/19       PT LONG TERM GOAL #3   Title  demonstrate improved gait pattern without pain for improved function    Status  New    Target Date  05/10/19            Plan - 03/31/19 2202    Clinical Impression Statement  Pt tolerated all exercises well, without increase in pain.  Pt needs frequent cues for form. Moderate encouragment to complete HEP outside of session to assist with meeting goals.  Adjusted HEP to encourage complaince.    Personal Factors and Comorbidities  Comorbidity 3+    Comorbidities  HTN, OA, obesity, depression    Examination-Activity Limitations  Locomotion Level    Examination-Participation Restrictions  Shop;Other   working   Stability/Clinical Decision Making  Evolving/Moderate complexity    Rehab Potential  Good    PT Frequency  2x / week    PT Duration  6 weeks    PT Treatment/Interventions  ADLs/Self Care Home Management;Cryotherapy;Electrical Stimulation;Moist Heat;Traction;Therapeutic exercise;Therapeutic activities;Functional mobility training;Stair training;Gait training;Ultrasound;Neuromuscular re-education;Patient/family education;Manual techniques;Taping;Dry needling    PT Next Visit Plan  review HEP, progress as able.    PT Home Exercise Plan  Access Code: [email or web address]    Consulted and Agree with Plan of Care  Patient       Patient will benefit from skilled therapeutic intervention in order to improve the following deficits and impairments:  Abnormal gait, Decreased strength, Pain, Difficulty walking, Decreased mobility, Decreased range of motion, Impaired flexibility, Postural dysfunction  Visit Diagnosis: 1. Other abnormalities of gait and mobility   2. Unsteadiness on feet        Problem List Patient Active Problem List   Diagnosis Date Noted  . Lumbar spinal stenosis 03/16/2019  . Dislocation of finger PIP joint 09/14/2018  . Elevated LFTs 04/22/2017  . Duodenal bulb ulcer with perforation ([place]) 02/20/2017  . 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 IFG (impaired fasting glucose) 09/09/2016  . Obesity (BMI 30.0-34.9) 07/29/2016  . Orthostasis 07/29/2016  . Alcohol abuse 11/29/2013  . Depression 11/29/2013  . HYPERTENSION, BENIGN 05/30/2008  . GERD 05/30/2008   Kerin Perna,  PTA 03/31/19 9:13 AM  [place], [place], [address] Phone: [phone]   Fax:  [phone]  Name: LAURIN PAULO MRN: [id number] Date of Birth: 1950/11/14     PHYSICAL THERAPY DISCHARGE SUMMARY  Visits from Start of Care: 2  Current functional level related to goals / functional outcomes: See above   Remaining deficits: unknown   Education / Equipment: HEP  Plan: Patient agrees to discharge.  Patient goals were not met. Patient is being discharged due to not returning since the last visit.  ?????    Laureen Abrahams, PT, DPT 05/04/19 1:44 PM  [facility] [place], [place] [address]  [phone] (office) [phone] (fax)

## 2019-04-05 ENCOUNTER — Encounter: Payer: Medicare Other | Admitting: Physical Therapy

## 2019-04-07 ENCOUNTER — Encounter: Payer: Self-pay | Admitting: Physical Therapy

## 2019-04-12 ENCOUNTER — Ambulatory Visit (INDEPENDENT_AMBULATORY_CARE_PROVIDER_SITE_OTHER): Payer: Medicare Other | Admitting: Sports Medicine

## 2019-04-12 ENCOUNTER — Other Ambulatory Visit: Payer: Self-pay | Admitting: *Deleted

## 2019-04-12 ENCOUNTER — Ambulatory Visit (INDEPENDENT_AMBULATORY_CARE_PROVIDER_SITE_OTHER): Payer: Medicare Other | Admitting: *Deleted

## 2019-04-12 ENCOUNTER — Encounter: Payer: Self-pay | Admitting: Sports Medicine

## 2019-04-12 DIAGNOSIS — M4807 Spinal stenosis, lumbosacral region: Secondary | ICD-10-CM | POA: Diagnosis not present

## 2019-04-12 DIAGNOSIS — Z0000 Encounter for general adult medical examination without abnormal findings: Secondary | ICD-10-CM | POA: Diagnosis not present

## 2019-04-12 DIAGNOSIS — M48061 Spinal stenosis, lumbar region without neurogenic claudication: Secondary | ICD-10-CM

## 2019-04-12 MED ORDER — METOPROLOL SUCCINATE ER 100 MG PO TB24: 100.0000 mg | ORAL_TABLET | Freq: Every day | ORAL | 0 refills | Status: DC

## 2019-04-12 MED ORDER — LOSARTAN POTASSIUM-HCTZ 100-12.5 MG PO TABS: 1.0000 | ORAL_TABLET | Freq: Every day | ORAL | 3 refills | Status: DC

## 2019-04-12 NOTE — Assessment & Plan Note (Signed)
Lumbar spinal stenosis with left-sided foot drop. Proceeding with MRI for epidural planning. She does not desire operative intervention, I did discuss the limitations of an epidural with improving weakness, if no improvement we could also certainly consider a carbon fiber AFO.

## 2019-04-12 NOTE — Progress Notes (Signed)
Subjective:   Alison Alison [name] is Alison [name] 68 y.o. female who presents for an Initial Medicare Annual Wellness Visit.  Review of Systems    No ROS.  Medicare Wellness Virtual Visit.  Visual/audio telehealth visit, UTA vital signs.   See social history for additional risk factors.     Cardiac Risk Factors include: hypertension;advanced age (>8255men, 34>65 women) Sleep patterns: Getting 6-8 hours Alison [name] night of sleep. Wakess up 2-3 times Alison [name] night to go to void. Wakes up feeling refreshed and ready for the day Home Safety/Smoke Alarms: Feels safe in home. Smoke alarms in place.  Living environment; Lives alone in Alison [name] 1 story apartment no stairs lives on bottom floor. Shower is Alison [name] step over tub and no grab bars ion place. Seat Belt Safety/Bike Helmet: Wears seat belt.   Female:   Pap- Aged out      Mammo-  ordered     Dexa scan- patient declined       CCS- UTD      Objective:    Today's Vitals   04/12/19 1457  PainSc: 0-No pain   There is no height or weight on file to calculate BMI.  Advanced Directives 04/12/2019 03/29/2019  Does Patient Have Alison [name] Medical Advance Directive? No No  Would patient like information on creating Alison [name] medical advance directive? No - Patient declined No - Patient declined    Current Medications (verified) Outpatient Encounter Medications as of 04/12/2019  Medication Sig  . esomeprazole (NEXIUM) 20 MG capsule Take by mouth.  . losartan-hydrochlorothiazide (HYZAAR) 100-12.5 MG tablet Take 1 tablet by mouth daily.  . metoprolol succinate (TOPROL-XL) 100 MG 24 hr tablet Take 1 tablet by mouth once daily  . diclofenac sodium (VOLTAREN) 1 % GEL APPLY 2 GRAMS TO AFFECTED AREA(S) FOUR TIMES DAILY (Patient not taking: Reported on 03/29/2019)  . Estradiol 10 MCG TABS vaginal tablet Place one tablet vaginally at bedtime twice Alison [name] week. (Patient not taking: Reported on 03/29/2019)  . traMADol (ULTRAM) 50 MG tablet Take 1 tablet (50 mg total) by mouth every 8 (eight) hours as needed for  moderate pain. Maximum 6 tabs per day. (Patient not taking: Reported on 03/29/2019)   No facility-administered encounter medications on file as of 04/12/2019.     Allergies (verified) Patient has no known allergies.   History: Past Medical History:  Diagnosis Date  . Endometriosis    Past Surgical History:  Procedure Laterality Date  . ABDOMINAL HYSTERECTOMY     Family History  Problem Relation Age of Onset  . Cancer Mother   . Hyperlipidemia Mother   . Hypertension Mother   . Alcohol abuse Father   . Cancer Other   . Heart attack Other   . Diabetes Other   . Stroke Other    Social History   Socioeconomic History  . Marital status: Single    Spouse name: Not on file  . Number of children: 1  . Years of education: 6612  . Highest education level: 12th grade  Occupational History  . Occupation: [employer]  Social Needs  . Financial resource strain: Not hard at all  . Food insecurity    Worry: Never true    Inability: Never true  . Transportation needs    Medical: No    Non-medical: No  Tobacco Use  . Smoking status: Never Smoker  . Smokeless tobacco: Never Used  Substance and Sexual Activity  . Alcohol use: Not Currently  . Drug use: No  . Sexual activity:  Yes    Comment: works FT, doesn't regularly exercise  Lifestyle  . Physical activity    Days per week: 0 days    Minutes per session: 0 min  . Stress: Not at all  Relationships  . Social connections    Talks on phone: More than three times Alison [name] week    Gets together: Once Alison [name] week    Attends religious service: More than 4 times per year    Active member of club or organization: No    Attends meetings of clubs or organizations: Never    Relationship status: Divorced  Other Topics Concern  . Not on file  Social History Narrative   Own candle in [place] given: Not Answered   Clinical Intake:  Pre-visit preparation completed: Yes  Pain : No/denies pain Pain  Score: 0-No pain     Nutritional Risks: None Diabetes: No  How often do you need to have someone help you when you read instructions, pamphlets, or other written materials from your doctor or pharmacy?: 1 - Never What is the last grade level you completed in school?: 12  Interpreter Needed?: No  Information entered by :: Orlie Dakin, LPN   Activities of Daily Living In your present state of health, do you have any difficulty performing the following activities: 04/12/2019  Hearing? N  Vision? N  Comment needs an eye appointment  Difficulty concentrating or making decisions? N  Walking or climbing stairs? N  Dressing or bathing? N  Doing errands, shopping? N  Preparing Food and eating ? N  Using the Toilet? N  In the past six months, have you accidently leaked urine? N  Do you have problems with loss of bowel control? N  Managing your Medications? N  Managing your Finances? N  Housekeeping or managing your Housekeeping? N  Some recent data might be hidden     Immunizations and Health Maintenance  There is no immunization history on file for this patient. Health Maintenance Due  Topic Date Due  . Hepatitis C Screening  08/21/1951  . MAMMOGRAM  07/30/2013  . DEXA SCAN  10/04/2016  . PNA vac Low Risk Adult (1 of 2 - PCV13) 10/04/2016    Patient Care Team: Hali Marry, MD as PCP - General  Indicate any recent Medical Services you may have received from other than [facility] providers in the past year (date may be approximate).     Assessment:   This is Alison [name] routine wellness examination for Alison Alison [name].Physical assessment deferred to PCP.   Hearing/Vision screen  Hearing Screening   125Hz  250Hz  500Hz  1000Hz  2000Hz  3000Hz  4000Hz  6000Hz  8000Hz   Right ear:           Left ear:           Comments: Hearing test not done-visit was via telephone due to COVID 19 pandemic  Vision Screening Comments: Vision test was not done- visit done via telephone due to [place]  pandemic  Dietary issues and exercise activities discussed: Current Exercise Habits: The patient does not participate in regular exercise at present, Exercise limited by: None identified Diet Eats Alison [name] healthy diet of fruits, vegetables and meats. Does not drink milk. Drinks 8 glasses of water Alison [name] day. Drinks caffeine 1 cup every morning. Breakfast:gravy biscuit with tomato and bacon Lunch: salad Dinner: [place] or meat and vegetable.      Goals    . Weight (lb) < 200 lb (90.7 kg)     Would  like to loose about 50 lbs.      Depression Screen PHQ 2/9 Scores 04/12/2019 06/03/2017  PHQ - 2 Score 0 0    Fall Risk Fall Risk  04/12/2019 06/03/2017  Falls in the past year? 0 No  Injury with Fall? 0 -  Follow up Falls prevention discussed -    Is the patient's home free of loose throw rugs in walkways, pet beds, electrical cords, etc?   yes      Grab bars in the bathroom? no      Handrails on the stairs?   yes      Adequate lighting?   yes   Cognitive Function:     6CIT Screen 04/12/2019  What Year? 0 points  What month? 0 points  What time? 0 points  Count back from 20 0 points  Months in reverse 0 points  Repeat phrase 0 points  Total Score 0    Screening Tests Health Maintenance  Topic Date Due  . Hepatitis C Screening  09/17/1951  . MAMMOGRAM  07/30/2013  . DEXA SCAN  10/04/2016  . PNA vac Low Risk Adult (1 of 2 - PCV13) 10/04/2016  . INFLUENZA VACCINE  05/08/2019  . COLONOSCOPY  11/10/2021  . TETANUS/TDAP  11/10/2021       Plan:      Alison Alison [name] , Thank you for taking time to come for your Medicare Wellness Visit. I appreciate your ongoing commitment to your health goals. Please review the following plan we discussed and let me know if I can assist you in the future.  Please schedule your next medicare wellness visit with me in 1 yr. Continue doing brain stimulating activities (puzzles, reading, adult coloring books, staying active) to keep memory sharp.     These  are the goals we discussed: Goals    . Weight (lb) < 200 lb (90.7 kg)     Would like to loose about 50 lbs.       This is Alison [name] list of the screening recommended for you and due dates:  Health Maintenance  Topic Date Due  .  Hepatitis C: One time screening is recommended by Center for Disease Control  (CDC) for  adults born from [birth year] through 1965.   09/17/1951  . Mammogram  07/30/2013  . DEXA scan (bone density measurement)  10/04/2016  . Pneumonia vaccines (1 of 2 - PCV13) 10/04/2016  . Flu Shot  05/08/2019  . Colon Cancer Screening  11/10/2021  . Tetanus Vaccine  11/10/2021     I have personally reviewed and noted the following in the patient's chart:   . Medical and social history . Use of alcohol, tobacco or illicit drugs  . Current medications and supplements . Functional ability and status . Nutritional status . Physical activity . Advanced directives . List of other physicians . Hospitalizations, surgeries, and ER visits in previous 12 months . Vitals . Screenings to include cognitive, depression, and falls . Referrals and appointments  In addition, I have reviewed and discussed with patient certain preventive protocols, quality metrics, and best practice recommendations. Alison [name] written personalized care plan for preventive services as well as general preventive health recommendations were provided to patient.     Alison Alison [name] , LPN   1/6/10967/03/2019

## 2019-04-12 NOTE — Patient Instructions (Signed)
Ms. Morren , Thank you for taking time to come for your Medicare Wellness Visit. I appreciate your ongoing commitment to your health goals. Please review the following plan we discussed and let me know if I can assist you in the future.  Please schedule your next medicare wellness visit with me in 1 yr. Continue doing brain stimulating activities (puzzles, reading, adult coloring books, staying active) to keep memory sharp.  These are the goals we discussed: Goals    . Weight (lb) < 200 lb (90.7 kg)     Would like to loose about 50 lbs.

## 2019-04-12 NOTE — Progress Notes (Signed)
refills  

## 2019-04-12 NOTE — Progress Notes (Signed)
Subjective:    CC: Foot drop  HPI: Alison [name] is a pleasant 68 year old female with lumbar spinal stenosis, her knees are doing well, she has had some weakness in her left foot, with numbness and tingling going down the leg.  She did not do any of the rehabilitation and she does endorse that she would not want operative intervention.  Symptoms are moderate, persistent, radiating down the leg.  I reviewed the past medical history, family history, social history, surgical history, and allergies today and no changes were needed.  Please see the problem list section below in epic for further details.  Past Medical History: Past Medical History:  Diagnosis Date  . Endometriosis    Past Surgical History: Past Surgical History:  Procedure Laterality Date  . ABDOMINAL HYSTERECTOMY     Social History: Social History   Socioeconomic History  . Marital status: Single    Spouse name: Not on file  . Number of children: Not on file  . Years of education: Not on file  . Highest education level: Not on file  Occupational History  . Not on file  Social Needs  . Financial resource strain: Not on file  . Food insecurity    Worry: Not on file    Inability: Not on file  . Transportation needs    Medical: Not on file    Non-medical: Not on file  Tobacco Use  . Smoking status: Never Smoker  . Smokeless tobacco: Never Used  Substance and Sexual Activity  . Alcohol use: Yes  . Drug use: No  . Sexual activity: Not on file    Comment: works FT, doesn't regularly exercise  Lifestyle  . Physical activity    Days per week: Not on file    Minutes per session: Not on file  . Stress: Not on file  Relationships  . Social Herbalist on phone: Not on file    Gets together: Not on file    Attends religious service: Not on file    Active member of club or organization: Not on file    Attends meetings of clubs or organizations: Not on file    Relationship status: Not on file  Other Topics  Concern  . Not on file  Social History Narrative  . Not on file   Family History: Family History  Problem Relation Age of Onset  . Cancer Mother   . Hyperlipidemia Mother   . Hypertension Mother   . Alcohol abuse Father   . Cancer Unknown   . Heart attack Unknown   . Diabetes Unknown   . Stroke Unknown    Allergies: No Known Allergies Medications: See med rec.  Review of Systems: No fevers, chills, night sweats, weight loss, chest pain, or shortness of breath.   Objective:    General: Well Developed, well nourished, and in no acute distress.  Neuro: Alert and oriented x3, extra-ocular muscles intact, sensation grossly intact.  HEENT: Normocephalic, atraumatic, pupils equal round reactive to light, neck supple, no masses, no lymphadenopathy, thyroid nonpalpable.  Skin: Warm and dry, no rashes. Cardiac: Regular rate and rhythm, no murmurs rubs or gallops, no lower extremity edema.  Respiratory: Clear to auscultation bilaterally. Not using accessory muscles, speaking in full sentences. Left foot: Weakness to dorsiflexion, 4-/5 compared to the right side.  Impression and Recommendations:    Lumbar spinal stenosis Lumbar spinal stenosis with left-sided foot drop. Proceeding with MRI for epidural planning. She does not desire operative intervention, I  did discuss the limitations of an epidural with improving weakness, if no improvement we could also certainly consider a carbon fiber AFO.   ___________________________________________ Ihor Austinhomas J. Benjamin Stainhekkekandam, M.D., ABFM., CAQSM. Primary Care and Sports Medicine [facility]  Adjunct Professor of Family Medicine  [employer]

## 2019-04-13 ENCOUNTER — Telehealth: Payer: Self-pay | Admitting: *Deleted

## 2019-04-13 DIAGNOSIS — I10 Essential (primary) hypertension: Secondary | ICD-10-CM

## 2019-04-13 DIAGNOSIS — R7301 Impaired fasting glucose: Secondary | ICD-10-CM

## 2019-04-13 DIAGNOSIS — Z13220 Encounter for screening for lipoid disorders: Secondary | ICD-10-CM

## 2019-04-13 NOTE — Telephone Encounter (Signed)
Called to advise pt that she must go get labs done. And told her that she will need to be fasting prior and can have water, black coffee, or unsweetened tea and to fast at least 8-10 hours before having this done. She voiced understanding and stated that she will go tomorrow to get them done.Alison [name], [name]

## 2019-04-14 DIAGNOSIS — H25813 Combined forms of age-related cataract, bilateral: Secondary | ICD-10-CM | POA: Diagnosis not present

## 2019-04-14 DIAGNOSIS — H524 Presbyopia: Secondary | ICD-10-CM | POA: Diagnosis not present

## 2019-04-18 ENCOUNTER — Ambulatory Visit (INDEPENDENT_AMBULATORY_CARE_PROVIDER_SITE_OTHER): Payer: Medicare Other

## 2019-04-18 ENCOUNTER — Other Ambulatory Visit: Payer: Self-pay

## 2019-04-18 DIAGNOSIS — M4807 Spinal stenosis, lumbosacral region: Secondary | ICD-10-CM

## 2019-04-18 DIAGNOSIS — M48061 Spinal stenosis, lumbar region without neurogenic claudication: Secondary | ICD-10-CM | POA: Diagnosis not present

## 2019-04-18 DIAGNOSIS — M2578 Osteophyte, vertebrae: Secondary | ICD-10-CM | POA: Diagnosis not present

## 2019-04-21 NOTE — Assessment & Plan Note (Signed)
Lumbar spinal stenosis left-sided symptoms, adding a left L3-L4 interlaminar epidural

## 2019-05-12 DIAGNOSIS — H35363 Drusen (degenerative) of macula, bilateral: Secondary | ICD-10-CM | POA: Diagnosis not present

## 2019-05-12 DIAGNOSIS — H25813 Combined forms of age-related cataract, bilateral: Secondary | ICD-10-CM | POA: Diagnosis not present

## 2019-05-12 DIAGNOSIS — H52203 Unspecified astigmatism, bilateral: Secondary | ICD-10-CM | POA: Diagnosis not present

## 2019-05-17 ENCOUNTER — Ambulatory Visit (INDEPENDENT_AMBULATORY_CARE_PROVIDER_SITE_OTHER): Payer: Medicare Other | Admitting: Sports Medicine

## 2019-05-17 ENCOUNTER — Other Ambulatory Visit: Payer: Self-pay

## 2019-05-17 ENCOUNTER — Encounter: Payer: Self-pay | Admitting: Sports Medicine

## 2019-05-17 DIAGNOSIS — M48061 Spinal stenosis, lumbar region without neurogenic claudication: Secondary | ICD-10-CM

## 2019-05-17 DIAGNOSIS — M170 Bilateral primary osteoarthritis of knee: Secondary | ICD-10-CM | POA: Diagnosis not present

## 2019-05-17 MED ORDER — TRAMADOL HCL 50 MG PO TABS: 25.0000 mg | ORAL_TABLET | Freq: Three times a day (TID) | ORAL | 0 refills | Status: DC | PRN

## 2019-05-17 NOTE — Assessment & Plan Note (Signed)
She was nearly pain-free post bilateral genicular nerve radiofrequency ablation/Coolief. This was back in May and June. Now having a recurrence of knee pain, adding tramadol to use as needed, we can inject her knees again but I would like to space it out at least 2 to 3 weeks from her cataract surgery.

## 2019-05-17 NOTE — Progress Notes (Signed)
Subjective:    CC: Follow-up  HPI: Oluwatamilore has left-sided low back pain, radiation to the left lower leg, she has a left L3-L4 disc protrusion, agreeable to proceed with epidural however she does have cataract surgery coming up in about 2 weeks.  Knee pain: She is post bilateral genicular radiofrequency ablation with initial complete pain relief, now having a recurrence of symptoms.  I reviewed the past medical history, family history, social history, surgical history, and allergies today and no changes were needed.  Please see the problem list section below in epic for further details.  Past Medical History: Past Medical History:  Diagnosis Date  . Endometriosis    Past Surgical History: Past Surgical History:  Procedure Laterality Date  . ABDOMINAL HYSTERECTOMY     Social History: Social History   Socioeconomic History  . Marital status: Single    Spouse name: Not on file  . Number of children: 1  . Years of education: 21  . Highest education level: 12th grade  Occupational History  . Occupation: Biomedical engineer  Social Needs  . Financial resource strain: Not hard at all  . Food insecurity    Worry: Never true    Inability: Never true  . Transportation needs    Medical: No    Non-medical: No  Tobacco Use  . Smoking status: Never Smoker  . Smokeless tobacco: Never Used  Substance and Sexual Activity  . Alcohol use: Not Currently  . Drug use: No  . Sexual activity: Yes    Comment: works FT, doesn't regularly exercise  Lifestyle  . Physical activity    Days per week: 0 days    Minutes per session: 0 min  . Stress: Not at all  Relationships  . Social connections    Talks on phone: More than three times a week    Gets together: Once a week    Attends religious service: More than 4 times per year    Active member of club or organization: No    Attends meetings of clubs or organizations: Never    Relationship status: Divorced  Other Topics Concern  . Not on  file  Social History Narrative   Own candle in [place]   Family History: Family History  Problem Relation Age of Onset  . Cancer Mother   . Hyperlipidemia Mother   . Hypertension Mother   . Alcohol abuse Father   . Cancer Other   . Heart attack Other   . Diabetes Other   . Stroke Other    Allergies: No Known Allergies Medications: See med rec.  Review of Systems: No fevers, chills, night sweats, weight loss, chest pain, or shortness of breath.   Objective:    General: Well Developed, well nourished, and in no acute distress.  Neuro: Alert and oriented x3, extra-ocular muscles intact, sensation grossly intact.  HEENT: Normocephalic, atraumatic, pupils equal round reactive to light, neck supple, no masses, no lymphadenopathy, thyroid nonpalpable.  Skin: Warm and dry, no rashes. Cardiac: Regular rate and rhythm, no murmurs rubs or gallops, no lower extremity edema.  Respiratory: Clear to auscultation bilaterally. Not using accessory muscles, speaking in full sentences.  Impression and Recommendations:    Lumbar spinal stenosis Mild lumbar stenosis, left-sided symptoms with a left L3-L4 protruding disc, there is some ligamentum flavum hypertrophy at this level. Proceeding with a left L3-L4 epidural at least several weeks after her upcoming cataract surgery on the 27th of this month. She can use low-dose tramadol in the  meantime.  Primary osteoarthritis of both knees post genicular RFA She was nearly pain-free post bilateral genicular nerve radiofrequency ablation/Coolief. This was back in May and June. Now having a recurrence of knee pain, adding tramadol to use as needed, we can inject her knees again but I would like to space it out at least 2 to 3 weeks from her cataract surgery.   ___________________________________________ Ihor Austinhomas J. Benjamin Stainhekkekandam, M.D., ABFM., CAQSM. Primary Care and Sports Medicine [facility]  Adjunct Professor of Family  Medicine  [employer]

## 2019-05-17 NOTE — Assessment & Plan Note (Signed)
Mild lumbar stenosis, left-sided symptoms with a left L3-L4 protruding disc, there is some ligamentum flavum hypertrophy at this level. Proceeding with a left L3-L4 epidural at least several weeks after her upcoming cataract surgery on the 27th of this month. She can use low-dose tramadol in the meantime.

## 2019-05-23 DIAGNOSIS — Z01812 Encounter for preprocedural laboratory examination: Secondary | ICD-10-CM | POA: Diagnosis not present

## 2019-05-24 DIAGNOSIS — H25813 Combined forms of age-related cataract, bilateral: Secondary | ICD-10-CM | POA: Diagnosis not present

## 2019-05-24 DIAGNOSIS — H52203 Unspecified astigmatism, bilateral: Secondary | ICD-10-CM | POA: Diagnosis not present

## 2019-05-27 DIAGNOSIS — H52203 Unspecified astigmatism, bilateral: Secondary | ICD-10-CM | POA: Diagnosis not present

## 2019-05-27 DIAGNOSIS — I10 Essential (primary) hypertension: Secondary | ICD-10-CM | POA: Diagnosis not present

## 2019-05-27 DIAGNOSIS — Z79899 Other long term (current) drug therapy: Secondary | ICD-10-CM | POA: Diagnosis not present

## 2019-05-27 DIAGNOSIS — K219 Gastro-esophageal reflux disease without esophagitis: Secondary | ICD-10-CM | POA: Diagnosis not present

## 2019-05-27 DIAGNOSIS — H25813 Combined forms of age-related cataract, bilateral: Secondary | ICD-10-CM | POA: Diagnosis not present

## 2019-05-27 DIAGNOSIS — H25812 Combined forms of age-related cataract, left eye: Secondary | ICD-10-CM | POA: Diagnosis not present

## 2019-05-28 DIAGNOSIS — H25811 Combined forms of age-related cataract, right eye: Secondary | ICD-10-CM | POA: Diagnosis not present

## 2019-05-28 DIAGNOSIS — H35363 Drusen (degenerative) of macula, bilateral: Secondary | ICD-10-CM | POA: Diagnosis not present

## 2019-05-28 DIAGNOSIS — H52203 Unspecified astigmatism, bilateral: Secondary | ICD-10-CM | POA: Diagnosis not present

## 2019-05-28 DIAGNOSIS — Z961 Presence of intraocular lens: Secondary | ICD-10-CM | POA: Diagnosis not present

## 2019-05-30 DIAGNOSIS — Z01812 Encounter for preprocedural laboratory examination: Secondary | ICD-10-CM | POA: Diagnosis not present

## 2019-05-30 DIAGNOSIS — H25813 Combined forms of age-related cataract, bilateral: Secondary | ICD-10-CM | POA: Diagnosis not present

## 2019-06-03 DIAGNOSIS — I10 Essential (primary) hypertension: Secondary | ICD-10-CM | POA: Diagnosis not present

## 2019-06-03 DIAGNOSIS — H25813 Combined forms of age-related cataract, bilateral: Secondary | ICD-10-CM | POA: Diagnosis not present

## 2019-06-03 DIAGNOSIS — H52203 Unspecified astigmatism, bilateral: Secondary | ICD-10-CM | POA: Diagnosis not present

## 2019-06-03 DIAGNOSIS — K219 Gastro-esophageal reflux disease without esophagitis: Secondary | ICD-10-CM | POA: Diagnosis not present

## 2019-06-03 DIAGNOSIS — H25812 Combined forms of age-related cataract, left eye: Secondary | ICD-10-CM | POA: Diagnosis not present

## 2019-06-03 DIAGNOSIS — Z79899 Other long term (current) drug therapy: Secondary | ICD-10-CM | POA: Diagnosis not present

## 2019-06-03 DIAGNOSIS — Z8711 Personal history of peptic ulcer disease: Secondary | ICD-10-CM | POA: Diagnosis not present

## 2019-06-03 DIAGNOSIS — Z83518 Family history of other specified eye disorder: Secondary | ICD-10-CM | POA: Diagnosis not present

## 2019-06-03 DIAGNOSIS — H25811 Combined forms of age-related cataract, right eye: Secondary | ICD-10-CM | POA: Insufficient documentation

## 2019-06-03 DIAGNOSIS — H35363 Drusen (degenerative) of macula, bilateral: Secondary | ICD-10-CM | POA: Diagnosis not present

## 2019-06-03 DIAGNOSIS — M1990 Unspecified osteoarthritis, unspecified site: Secondary | ICD-10-CM | POA: Diagnosis not present

## 2019-06-04 DIAGNOSIS — Z961 Presence of intraocular lens: Secondary | ICD-10-CM | POA: Diagnosis not present

## 2019-06-04 DIAGNOSIS — H52202 Unspecified astigmatism, left eye: Secondary | ICD-10-CM | POA: Diagnosis not present

## 2019-06-04 DIAGNOSIS — H35363 Drusen (degenerative) of macula, bilateral: Secondary | ICD-10-CM | POA: Diagnosis not present

## 2019-06-04 LAB — HM DIABETES EYE EXAM

## 2019-06-10 ENCOUNTER — Encounter: Payer: Self-pay | Admitting: Family Medicine

## 2019-06-11 ENCOUNTER — Encounter: Payer: Self-pay | Admitting: Family Medicine

## 2019-06-11 DIAGNOSIS — H52202 Unspecified astigmatism, left eye: Secondary | ICD-10-CM | POA: Diagnosis not present

## 2019-06-11 DIAGNOSIS — Z961 Presence of intraocular lens: Secondary | ICD-10-CM | POA: Diagnosis not present

## 2019-06-11 DIAGNOSIS — H35363 Drusen (degenerative) of macula, bilateral: Secondary | ICD-10-CM | POA: Diagnosis not present

## 2019-06-21 ENCOUNTER — Other Ambulatory Visit: Payer: Self-pay

## 2019-06-21 ENCOUNTER — Ambulatory Visit
Admission: RE | Admit: 2019-06-21 | Discharge: 2019-06-21 | Disposition: A | Discharge status: Home / Self Care | Payer: Medicare Other | Source: Ambulatory Visit | Attending: Sports Medicine | Admitting: Sports Medicine

## 2019-06-21 DIAGNOSIS — M545 Low back pain: Secondary | ICD-10-CM | POA: Diagnosis not present

## 2019-06-21 MED ORDER — METHYLPREDNISOLONE ACETATE 40 MG/ML INJ SUSP (RADIOLOG
120.0000 mg | Freq: Once | INTRAMUSCULAR | Status: AC
  Administered 2019-06-21: 120 mg via EPIDURAL

## 2019-06-21 MED ORDER — IOPAMIDOL (ISOVUE-M 200) INJECTION 41%
1.0000 mL | Freq: Once | INTRAMUSCULAR | Status: AC
  Administered 2019-06-21: 1 mL via EPIDURAL

## 2019-06-21 NOTE — Discharge Instructions (Signed)

## 2019-06-25 ENCOUNTER — Other Ambulatory Visit: Payer: Self-pay

## 2019-06-25 ENCOUNTER — Ambulatory Visit (INDEPENDENT_AMBULATORY_CARE_PROVIDER_SITE_OTHER): Payer: Medicare Other | Admitting: Sports Medicine

## 2019-06-25 DIAGNOSIS — M170 Bilateral primary osteoarthritis of knee: Secondary | ICD-10-CM | POA: Diagnosis not present

## 2019-06-25 NOTE — Assessment & Plan Note (Signed)
Bilateral knee injection, she does understand that she is approaching total knee arthroplasty. She is post bilateral genicular radiofrequency ablation/Coolief back in May or June of this year with moderate relief.

## 2019-06-25 NOTE — Progress Notes (Signed)
Subjective:    CC: Follow-up  HPI: Bilateral knee pain: Post genicular radiofrequency ablation back in May/June, having a slight recurrence of pain, desires injection, pain is moderate, persistent, localized over the kneecap bilaterally.  I reviewed the past medical history, family history, social history, surgical history, and allergies today and no changes were needed.  Please see the problem list section below in epic for further details.  Past Medical History: Past Medical History:  Diagnosis Date  . Endometriosis    Past Surgical History: Past Surgical History:  Procedure Laterality Date  . ABDOMINAL HYSTERECTOMY     Social History: Social History   Socioeconomic History  . Marital status: Single    Spouse name: Not on file  . Number of children: 1  . Years of education: 56  . Highest education level: 12th grade  Occupational History  . Occupation: Biomedical engineer  Social Needs  . Financial resource strain: Not hard at all  . Food insecurity    Worry: Never true    Inability: Never true  . Transportation needs    Medical: No    Non-medical: No  Tobacco Use  . Smoking status: Never Smoker  . Smokeless tobacco: Never Used  Substance and Sexual Activity  . Alcohol use: Not Currently  . Drug use: No  . Sexual activity: Yes    Comment: works FT, doesn't regularly exercise  Lifestyle  . Physical activity    Days per week: 0 days    Minutes per session: 0 min  . Stress: Not at all  Relationships  . Social connections    Talks on phone: More than three times a week    Gets together: Once a week    Attends religious service: More than 4 times per year    Active member of club or organization: No    Attends meetings of clubs or organizations: Never    Relationship status: Divorced  Other Topics Concern  . Not on file  Social History Narrative   Own candle in [place]   Family History: Family History  Problem Relation Age of Onset  . Cancer Mother    . Hyperlipidemia Mother   . Hypertension Mother   . Alcohol abuse Father   . Cancer Other   . Heart attack Other   . Diabetes Other   . Stroke Other    Allergies: No Known Allergies Medications: See med rec.  Review of Systems: No fevers, chills, night sweats, weight loss, chest pain, or shortness of breath.   Objective:    General: Well Developed, well nourished, and in no acute distress.  Neuro: Alert and oriented x3, extra-ocular muscles intact, sensation grossly intact.  HEENT: Normocephalic, atraumatic, pupils equal round reactive to light, neck supple, no masses, no lymphadenopathy, thyroid nonpalpable.  Skin: Warm and dry, no rashes. Cardiac: Regular rate and rhythm, no murmurs rubs or gallops, no lower extremity edema.  Respiratory: Clear to auscultation bilaterally. Not using accessory muscles, speaking in full sentences. Bilateral knees: Varus deformity, tender to palpation at the medial and lateral joint lines as well as the patellar facets.  Minimal effusion. ROM normal in flexion and extension and lower leg rotation. Ligaments with solid consistent endpoints including ACL, PCL, LCL, MCL. Negative Mcmurray's and provocative meniscal tests. Non painful patellar compression. Patellar and quadriceps tendons unremarkable. Hamstring and quadriceps strength is normal.  Procedure: Real-time Ultrasound Guided injection of the left knee Device: GE Logiq E  Verbal informed consent obtained.  Time-out conducted.  Noted  no overlying erythema, induration, or other signs of local infection.  Skin prepped in a sterile fashion.  Local anesthesia: Topical Ethyl chloride.  With sterile technique and under real time ultrasound guidance:  1 cc Kenalog 40, 2 cc lidocaine, 2 cc bupivacaine injected easily Completed without difficulty  Pain immediately resolved suggesting accurate placement of the medication.  Advised to call if fevers/chills, erythema, induration, drainage, or  persistent bleeding.  Images permanently stored and available for review in the ultrasound unit.  Impression: Technically successful ultrasound guided injection.  Procedure: Real-time Ultrasound Guided injection of the right knee Device: GE Logiq E  Verbal informed consent obtained.  Time-out conducted.  Noted no overlying erythema, induration, or other signs of local infection.  Skin prepped in a sterile fashion.  Local anesthesia: Topical Ethyl chloride.  With sterile technique and under real time ultrasound guidance:  1 cc Kenalog 40, 2 cc lidocaine, 2 cc bupivacaine injected easily Completed without difficulty  Pain immediately resolved suggesting accurate placement of the medication.  Advised to call if fevers/chills, erythema, induration, drainage, or persistent bleeding.  Images permanently stored and available for review in the ultrasound unit.  Impression: Technically successful ultrasound guided injection.  Impression and Recommendations:    Primary osteoarthritis of both knees post genicular RFA Bilateral knee injection, she does understand that she is approaching total knee arthroplasty. She is post bilateral genicular radiofrequency ablation/Coolief back in May or June of this year with moderate relief.   ___________________________________________ Ihor Austinhomas J. Benjamin Stainhekkekandam, M.D., ABFM., CAQSM. Primary Care and Sports Medicine [facility]  Adjunct Professor of Family Medicine  [employer]

## 2019-07-01 ENCOUNTER — Telehealth: Payer: Self-pay

## 2019-07-01 NOTE — Telephone Encounter (Signed)
Alison [name] would like to speak with Tonya.

## 2019-07-01 NOTE — Telephone Encounter (Signed)
lvm advising pt that I was returning her call.Alison [name], [name], CMA

## 2019-07-02 NOTE — Telephone Encounter (Signed)
lvm asking pt to rtn call.Maryruth Eve, [name], CMA

## 2019-07-05 ENCOUNTER — Encounter: Payer: Self-pay | Admitting: Family Medicine

## 2019-07-05 ENCOUNTER — Other Ambulatory Visit: Payer: Self-pay

## 2019-07-05 ENCOUNTER — Ambulatory Visit (INDEPENDENT_AMBULATORY_CARE_PROVIDER_SITE_OTHER): Payer: Medicare Other | Admitting: Family Medicine

## 2019-07-05 VITALS — BP 138/68 | HR 67 | Ht 61.0 in | Wt 175.0 lb

## 2019-07-05 DIAGNOSIS — N941 Unspecified dyspareunia: Secondary | ICD-10-CM

## 2019-07-05 DIAGNOSIS — N952 Postmenopausal atrophic vaginitis: Secondary | ICD-10-CM | POA: Diagnosis not present

## 2019-07-05 DIAGNOSIS — I10 Essential (primary) hypertension: Secondary | ICD-10-CM

## 2019-07-05 MED ORDER — PREMARIN 0.625 MG/GM VA CREA: 1.0000 | TOPICAL_CREAM | Freq: Every day | VAGINAL | 2 refills | Status: DC

## 2019-07-05 NOTE — Progress Notes (Signed)
Established Patient Office Visit  Subjective:  Patient ID: Alison [name], female    DOB: 11-16-1950  Age: 68 y.o. MRN: [id number]  CC:  Chief Complaint  Patient presents with  . Follow-up    HPI Alison [name] presents for pain with intercourse.  She has a new partner of recent and has been trying to have sexual intercourse and unfortunately she feels like the vaginal canal is too tight.  She says years ago when she had a hysterectomy she had asked the doctor to put in a "few extra stitches".  She has been using some over-the-counter suppositories about 30 minutes before intercourse and she says it does help but does make a big difference but it just is not convenient.  She had discussed some vaginal atrophy symptoms about a year ago with 1 of my partners and at the time she was sent a prescription for estradiol tabs to be inserted vaginally but she says that the cost was over $100 at the time and did not want to spend the money.  She said she actually has bought some dilators and says even the smallest one is quite painful. unfortunately her partner has suffered a penile fracture.    Hypertension- Pt denies chest pain, SOB, dizziness, or heart palpitations.  Taking meds as directed w/o problems.  Denies medication side effects.      Past Medical History:  Diagnosis Date  . Endometriosis     Past Surgical History:  Procedure Laterality Date  . ABDOMINAL HYSTERECTOMY      Family History  Problem Relation Age of Onset  . Cancer Mother   . Hyperlipidemia Mother   . Hypertension Mother   . Alcohol abuse Father   . Cancer Other   . Heart attack Other   . Diabetes Other   . Stroke Other     Social History   Socioeconomic History  . Marital status: Single    Spouse name: Not on file  . Number of children: 1  . Years of education: 32  . Highest education level: 12th grade  Occupational History  . Occupation: Biomedical engineer  Social Needs  . Financial resource strain: Not  hard at all  . Food insecurity    Worry: Never true    Inability: Never true  . Transportation needs    Medical: No    Non-medical: No  Tobacco Use  . Smoking status: Never Smoker  . Smokeless tobacco: Never Used  Substance and Sexual Activity  . Alcohol use: Not Currently  . Drug use: No  . Sexual activity: Yes    Comment: works FT, doesn't regularly exercise  Lifestyle  . Physical activity    Days per week: 0 days    Minutes per session: 0 min  . Stress: Not at all  Relationships  . Social connections    Talks on phone: More than three times a week    Gets together: Once a week    Attends religious service: More than 4 times per year    Active member of club or organization: No    Attends meetings of clubs or organizations: Never    Relationship status: Divorced  . Intimate partner violence    Fear of current or ex partner: No    Emotionally abused: No    Physically abused: No    Forced sexual activity: No  Other Topics Concern  . Not on file  Social History Narrative   Own candle in [place]  Outpatient Medications Prior to Visit  Medication Sig Dispense Refill  . esomeprazole (NEXIUM) 20 MG capsule Take by mouth.    . losartan-hydrochlorothiazide (HYZAAR) 100-12.5 MG tablet Take 1 tablet by mouth daily. 90 tablet 3  . metoprolol succinate (TOPROL-XL) 100 MG 24 hr tablet Take 1 tablet (100 mg total) by mouth daily. Take with or immediately following a meal. 90 tablet 0  . traMADol (ULTRAM) 50 MG tablet Take 0.5 tablets (25 mg total) by mouth every 8 (eight) hours as needed for moderate pain. Maximum 6 tabs per day. 21 tablet 0   No facility-administered medications prior to visit.     No Known Allergies  ROS Review of Systems    Objective:    Physical Exam  Constitutional: She is oriented to person, place, and time. She appears well-developed and well-nourished.  HENT:  Head: Normocephalic and atraumatic.  Eyes: Conjunctivae and EOM are normal.   Cardiovascular: Normal rate, regular rhythm and normal heart sounds.  Pulmonary/Chest: Effort normal and breath sounds normal.  Neurological: She is alert and oriented to person, place, and time.  Skin: Skin is warm and dry. No pallor.  Psychiatric: She has a normal mood and affect. Her behavior is normal.  Vitals reviewed.   BP 138/68   Pulse 67   Ht 5\' 1"  (1.549 m)   Wt 175 lb (79.4 kg)   SpO2 98%   BMI 33.07 kg/m  Wt Readings from Last 3 Encounters:  07/05/19 175 lb (79.4 kg)  06/25/19 179 lb (81.2 kg)  05/17/19 179 lb (81.2 kg)     Health Maintenance Due  Topic Date Due  . MAMMOGRAM  07/30/2013  . DEXA SCAN  10/04/2016    There are no preventive care reminders to display for this patient.  Lab Results  Component Value Date   TSH 2.98 01/31/2016   Lab Results  Component Value Date   WBC 3.5 (L) 11/16/2012   HGB 13.7 11/16/2012   HCT 40.5 11/16/2012   MCV 99.0 11/16/2012   PLT 221 11/16/2012   Lab Results  Component Value Date   NA 139 01/31/2016   K 4.7 01/31/2016   CO2 26 01/31/2016   GLUCOSE 120 (H) 01/31/2016   BUN 14 01/31/2016   CREATININE 0.76 01/31/2016   BILITOT 0.8 01/31/2016   ALKPHOS 76 01/31/2016   AST 15 01/31/2016   ALT 10 01/31/2016   PROT 6.2 01/31/2016   ALBUMIN 3.6 01/31/2016   CALCIUM 8.9 01/31/2016   Lab Results  Component Value Date   CHOL 217 (H) 01/31/2016   Lab Results  Component Value Date   HDL 116 01/31/2016   Lab Results  Component Value Date   LDLCALC 88 01/31/2016   Lab Results  Component Value Date   TRIG 63 01/31/2016   Lab Results  Component Value Date   CHOLHDL 1.9 01/31/2016   Lab Results  Component Value Date   HGBA1C 5.8 (A) 03/23/2018      Assessment & Plan:   Problem List Items Addressed This Visit      Cardiovascular and Mediastinum   HYPERTENSION, BENIGN - Primary    BP ok but borderline. Will keep an eye on it.         Genitourinary   Atrophic vaginitis    Will try topical  vag estrogen since the tabs were quite costly.        Relevant Orders   Ambulatory referral to Obstetrics / Gynecology     Other  Dyspareunia, female    Discussed options.  Being that she is postmenopausal I do think she would benefit from some vaginal estrogen as well as continuing to work on using her dilators with a lubricant.  We also discussed possible referral to GYN for consultation. Continue the OTC suppositories as well since do seem to help.           Meds ordered this encounter  Medications  . conjugated estrogens (PREMARIN) vaginal cream    Sig: Place 1 Applicatorful vaginally daily. X 2 weeks, the decrease to twice a week    Dispense:  60 g    Refill:  2    Follow-up: Return in about 6 months (around 01/02/2020) for Hypertension.    Nani Gasser, MD

## 2019-07-05 NOTE — Telephone Encounter (Signed)
Pt had an appt today.Maryruth Eve, [name], CMA

## 2019-07-05 NOTE — Assessment & Plan Note (Addendum)
Discussed options.  Being that she is postmenopausal I do think she would benefit from some vaginal estrogen as well as continuing to work on using her dilators with a lubricant.  We also discussed possible referral to GYN for consultation. Continue the OTC suppositories as well since do seem to help.

## 2019-07-05 NOTE — Assessment & Plan Note (Signed)
Will try topical vag estrogen since the tabs were quite costly.

## 2019-07-05 NOTE — Assessment & Plan Note (Signed)
BP ok but borderline. Will keep an eye on it.

## 2019-07-08 ENCOUNTER — Other Ambulatory Visit: Payer: Self-pay | Admitting: Family Medicine

## 2019-08-30 DIAGNOSIS — E876 Hypokalemia: Secondary | ICD-10-CM | POA: Insufficient documentation

## 2019-08-30 DIAGNOSIS — R0602 Shortness of breath: Secondary | ICD-10-CM | POA: Diagnosis not present

## 2019-08-30 DIAGNOSIS — R112 Nausea with vomiting, unspecified: Secondary | ICD-10-CM | POA: Diagnosis not present

## 2019-08-30 DIAGNOSIS — K219 Gastro-esophageal reflux disease without esophagitis: Secondary | ICD-10-CM | POA: Diagnosis not present

## 2019-08-30 DIAGNOSIS — E871 Hypo-osmolality and hyponatremia: Secondary | ICD-10-CM | POA: Insufficient documentation

## 2019-08-30 DIAGNOSIS — Z9049 Acquired absence of other specified parts of digestive tract: Secondary | ICD-10-CM | POA: Diagnosis not present

## 2019-08-30 DIAGNOSIS — R197 Diarrhea, unspecified: Secondary | ICD-10-CM | POA: Diagnosis not present

## 2019-08-30 DIAGNOSIS — Z79899 Other long term (current) drug therapy: Secondary | ICD-10-CM | POA: Diagnosis not present

## 2019-08-30 DIAGNOSIS — M48061 Spinal stenosis, lumbar region without neurogenic claudication: Secondary | ICD-10-CM | POA: Diagnosis not present

## 2019-08-30 DIAGNOSIS — Z880 Allergy status to penicillin: Secondary | ICD-10-CM | POA: Diagnosis not present

## 2019-08-30 DIAGNOSIS — I10 Essential (primary) hypertension: Secondary | ICD-10-CM | POA: Diagnosis not present

## 2019-08-30 DIAGNOSIS — I951 Orthostatic hypotension: Secondary | ICD-10-CM | POA: Diagnosis not present

## 2019-08-30 DIAGNOSIS — K529 Noninfective gastroenteritis and colitis, unspecified: Secondary | ICD-10-CM | POA: Diagnosis not present

## 2019-08-31 DIAGNOSIS — K529 Noninfective gastroenteritis and colitis, unspecified: Secondary | ICD-10-CM | POA: Diagnosis not present

## 2019-08-31 DIAGNOSIS — E871 Hypo-osmolality and hyponatremia: Secondary | ICD-10-CM | POA: Diagnosis not present

## 2019-08-31 DIAGNOSIS — I10 Essential (primary) hypertension: Secondary | ICD-10-CM | POA: Diagnosis not present

## 2019-08-31 DIAGNOSIS — E876 Hypokalemia: Secondary | ICD-10-CM | POA: Diagnosis not present

## 2019-08-31 DIAGNOSIS — K219 Gastro-esophageal reflux disease without esophagitis: Secondary | ICD-10-CM | POA: Diagnosis not present

## 2019-09-14 ENCOUNTER — Other Ambulatory Visit: Payer: Self-pay | Admitting: Family Medicine

## 2019-09-18 IMAGING — MR MRI LUMBAR SPINE WITHOUT CONTRAST
4 of 5 series · 26 of 48 positions shown · non-contrast
Comparison: 03/16/2019 lumbar spine radiograph.

CLINICAL DATA: Left foot drop many years.

EXAM:
MRI LUMBAR SPINE WITHOUT CONTRAST
TECHNIQUE: Multiplanar, multisequence MR imaging of the lumbar spine was
performed. No intravenous contrast was administered.

[Series 3: T2 · sagittal · 4.0mm · 0.81mm/px · 6 of 15 slices shown (1 of 2)]
[im 1/15]
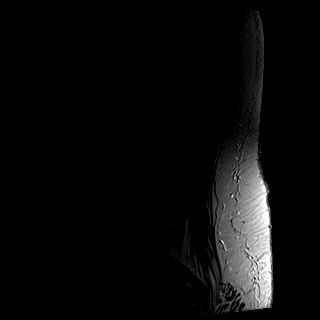
[im 3/15]
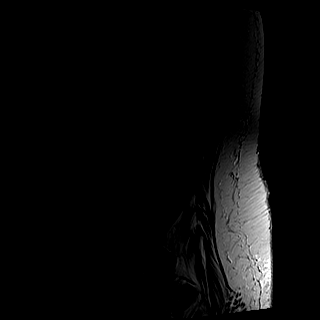
[im 6/15]
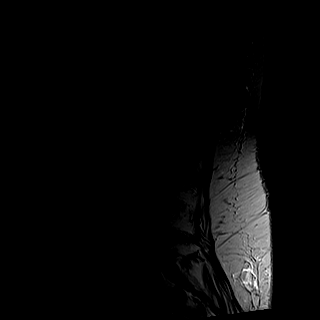
[im 9/15]
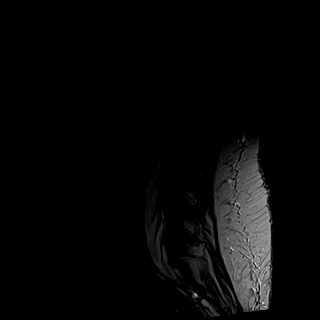
[im 12/15]
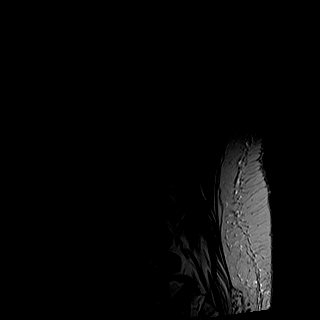
[im 15/15]
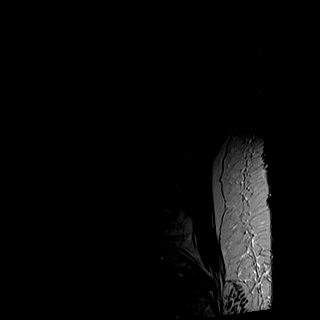

[Series 4: T1 · sagittal · 4.0mm · 0.41mm/px · 6 of 15 slices shown (1 of 2)]
[im 1/15]
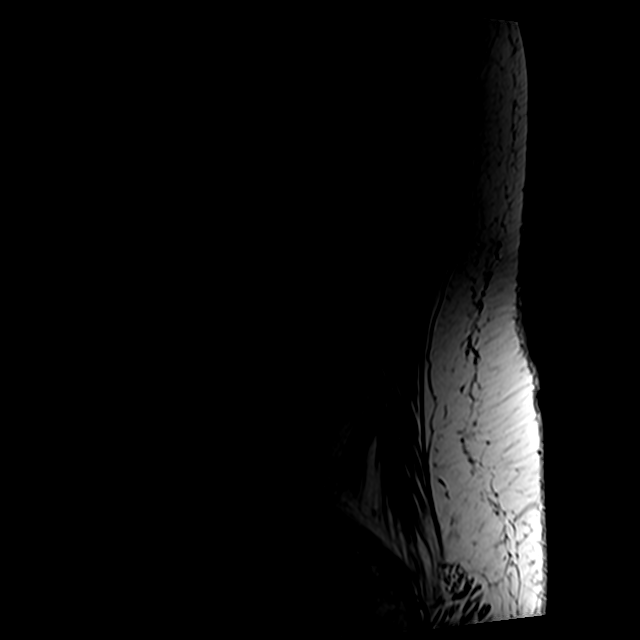
[im 3/15]
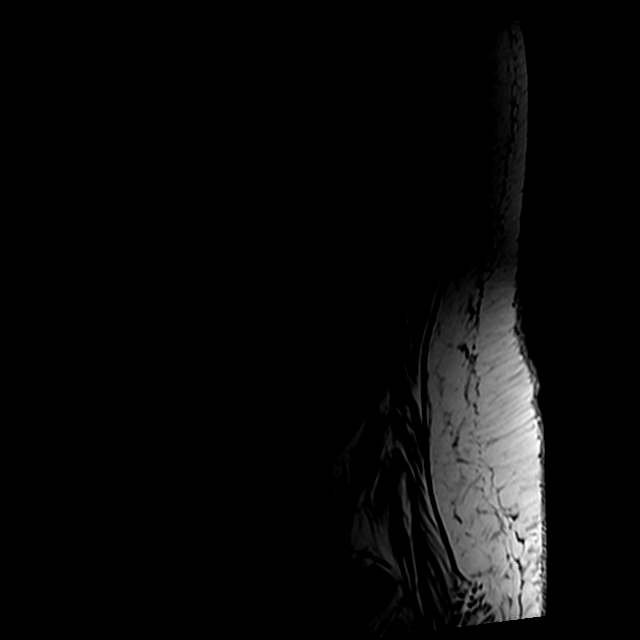
[im 6/15]
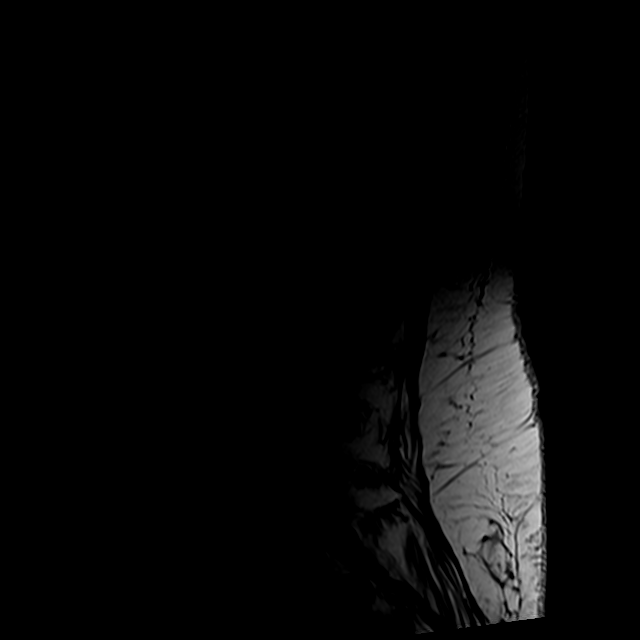
[im 9/15]
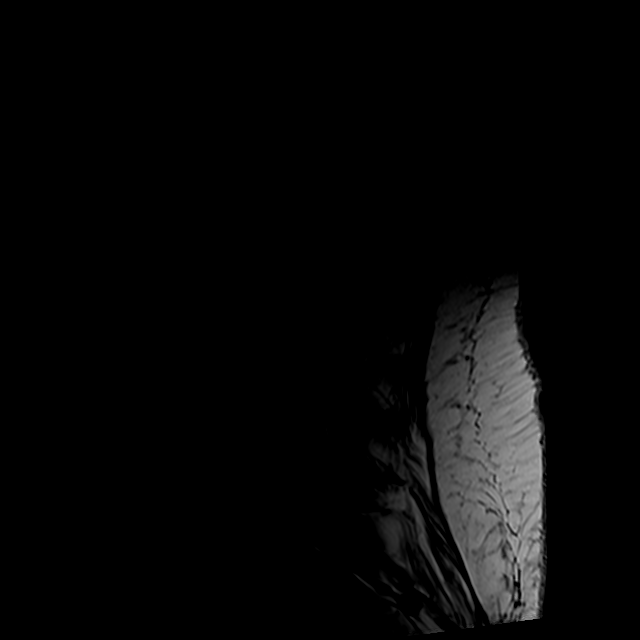
[im 12/15]
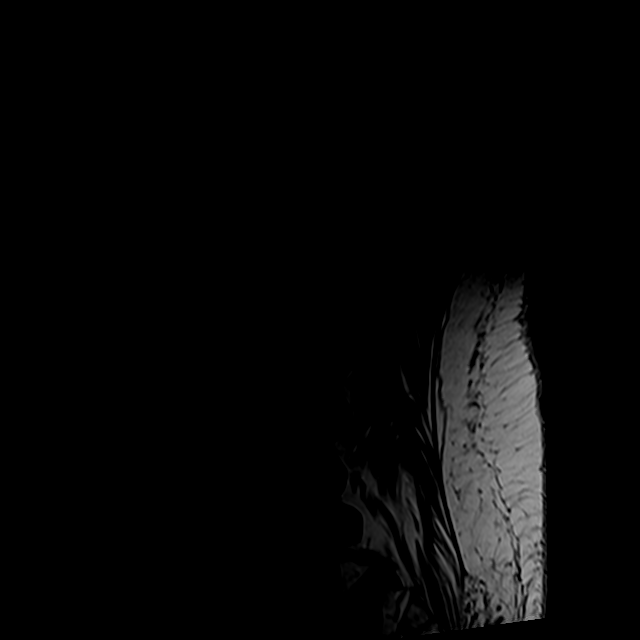
[im 15/15]
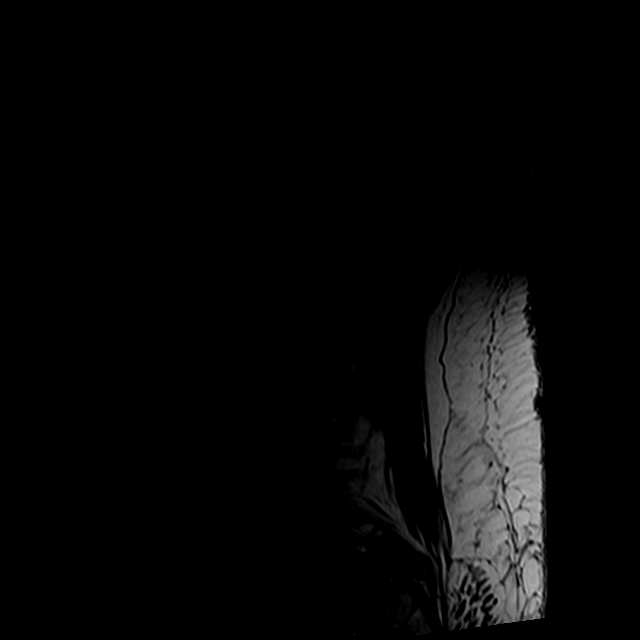

[Series 6: T2 · axial · 4.0mm · 0.78mm/px · z∈[-89,+109]mm · 9 of 37 slices shown (2 of 2)]
[im 1/37]
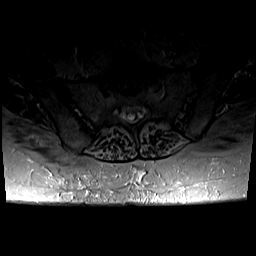
[im 6/37]
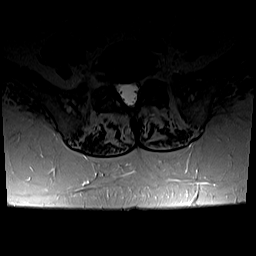
[im 11/37]
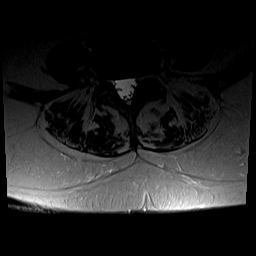
[im 16/37]
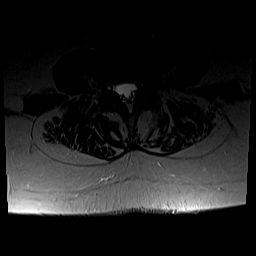
[im 19/37]
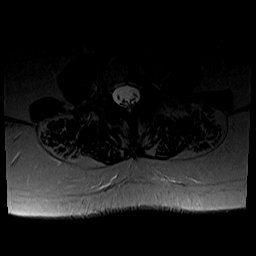
[im 21/37]
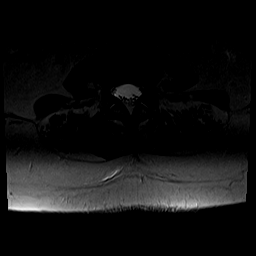
[im 26/37]
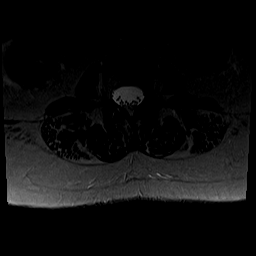
[im 31/37]
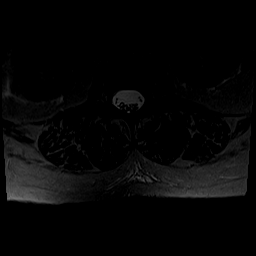
[im 37/37]
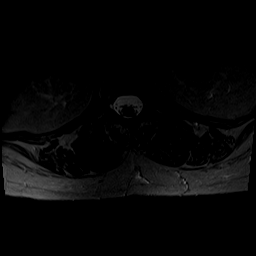

[Series 7: T1 · axial · 4.0mm · 0.39mm/px · z∈[-89,+79]mm · 5 of 37 slices shown (2 of 2)]
[im 1/37]
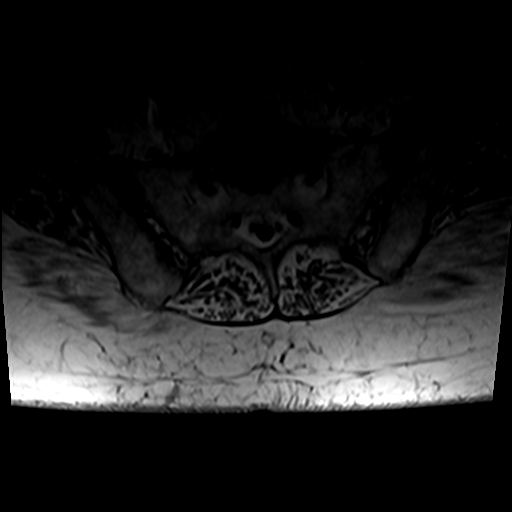
[im 6/37]
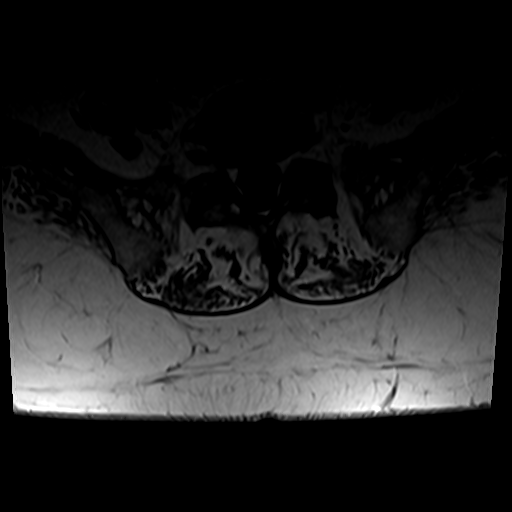
[im 11/37]
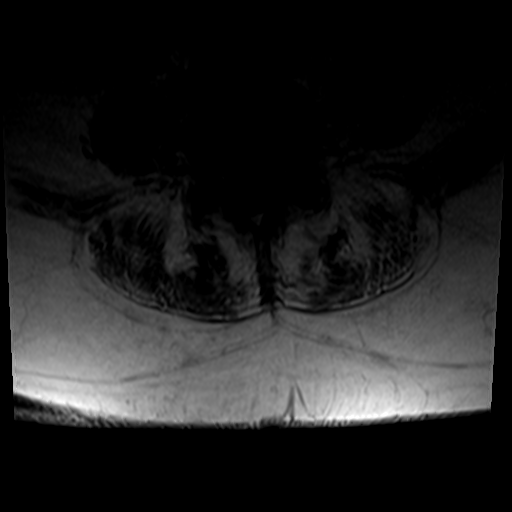
[im 19/37]
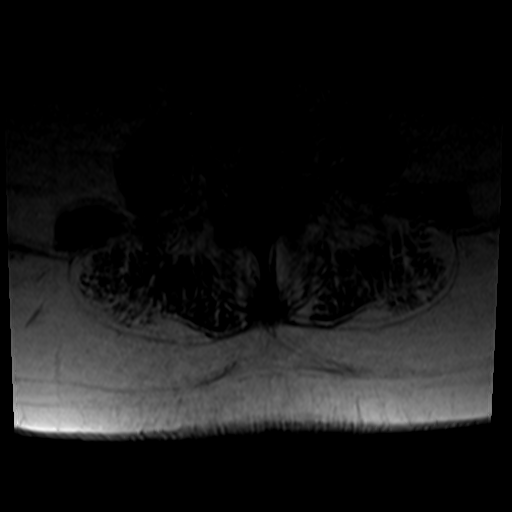
[im 31/37]
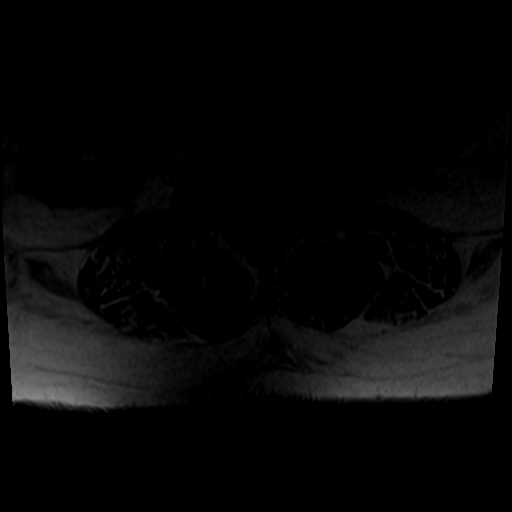

[26 of 48 positions shown; findings below may reference images not displayed]

FINDINGS: Segmentation:  Normal

Alignment:  Normal

Vertebrae: There are hemangiomata at T12, L2 and L3. No acute
compression fracture, discitis-osteomyelitis, facet edema or other
focal marrow lesion. No epidural collection.

Conus medullaris and cauda equina: Conus extends to the L1 level.
Conus and cauda equina appear normal.

Paraspinal and other soft tissues: Negative.

Disc levels:

T10-11: Disc space narrowing without spinal canal or neural
foraminal stenosis. Imaged only in the sagittal plane.

T11-12: Imaged only in the sagittal plane.  Normal.

T12-L1: Normal.

L1-2: Normal.

L2-3: Small disc bulge without spinal canal or neural foraminal
stenosis.

L3-4: Disc space narrowing with mild bulge. No spinal canal
stenosis. Mild bilateral neural foraminal stenosis, left worse than
right.

L4-5: Disc space narrowing with mild bulge. No spinal canal or
neural foraminal stenosis.

L5-S1: Right extraforaminal disc osteophyte complex in close
proximity to the exiting right L5 nerve root. No spinal canal or
neural foraminal stenosis.
IMPRESSION: 1. No high-grade spinal canal or neural foraminal stenosis of the
lumbar spine.
2. Mild bilateral L3-4 neural foraminal stenosis.
3. Extraforaminal right-sided disc osteophyte complex at L5-S1 in
close proximity to the exiting right L5 nerve root.

## 2019-09-21 ENCOUNTER — Ambulatory Visit: Payer: Medicare Other | Admitting: Sports Medicine

## 2019-10-04 ENCOUNTER — Telehealth: Payer: Self-pay

## 2019-10-04 NOTE — Telephone Encounter (Signed)
Suzane called wanting to speak with Tonya. 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

## 2019-10-05 ENCOUNTER — Other Ambulatory Visit: Payer: Self-pay | Admitting: *Deleted

## 2019-10-05 MED ORDER — NALTREXONE HCL 50 MG PO TABS: 50.0000 mg | ORAL_TABLET | Freq: Every day | ORAL | 0 refills | Status: DC

## 2019-10-05 NOTE — Progress Notes (Signed)
Pt called asking for refill of this medication. I asked her about whether or not she is seeking other services for this matter and she assured me that she is. I told her that she really needs to seek out help to figure out what may have triggered this for her and she said that she is definitely going to do what is needed to figure this out.  Maryruth Eve, [name], CMA

## 2019-10-05 NOTE — Telephone Encounter (Signed)
Returned pt's call.Alison [name]   

## 2019-10-12 ENCOUNTER — Other Ambulatory Visit: Payer: Self-pay | Admitting: Family Medicine

## 2019-10-14 ENCOUNTER — Ambulatory Visit (INDEPENDENT_AMBULATORY_CARE_PROVIDER_SITE_OTHER): Payer: Medicare Other | Admitting: Medical-Surgical

## 2019-10-14 ENCOUNTER — Other Ambulatory Visit: Payer: Self-pay

## 2019-10-14 ENCOUNTER — Telehealth: Payer: Self-pay | Admitting: *Deleted

## 2019-10-14 ENCOUNTER — Encounter: Payer: Self-pay | Admitting: Medical-Surgical

## 2019-10-14 VITALS — BP 147/74 | HR 60 | Temp 97.7°F | Ht 61.0 in | Wt 179.0 lb

## 2019-10-14 DIAGNOSIS — M170 Bilateral primary osteoarthritis of knee: Secondary | ICD-10-CM

## 2019-10-14 DIAGNOSIS — L03011 Cellulitis of right finger: Secondary | ICD-10-CM

## 2019-10-14 MED ORDER — CEPHALEXIN 500 MG PO CAPS: 500.0000 mg | ORAL_CAPSULE | Freq: Three times a day (TID) | ORAL | 0 refills | Status: DC

## 2019-10-14 MED ORDER — CEPHALEXIN 500 MG PO CAPS: 500.0000 mg | ORAL_CAPSULE | Freq: Two times a day (BID) | ORAL | 0 refills | Status: DC

## 2019-10-14 NOTE — Progress Notes (Addendum)
Subjective:    CC: Right pinky finger redness  HPI:  Very pleasant 69 year old female presenting with complaints of a possible infection of her right pinky fingernail.  Reports having a manicure 2 months ago at which time she was cut by one of the [employer].  The skin surrounding the fingernail became swollen and red shortly after.  She has been using Neosporin topically for the last 3 to 4 weeks with no improvement in symptoms.  The skin is very itchy and her nail has partially lifted from the nail bed.  She has been keeping it covered with Band-Aids as much as possible. + Itching.  Reports skin on tip of finger has cracked open several times and began bleeding.  Denies fever, chills, red streaks.   I reviewed the past medical history, family history, social history, surgical history, and allergies today and no changes were needed.  Please see the problem list section below in epic for further details.  Past Medical History: Past Medical History:  Diagnosis Date  . Endometriosis    Past Surgical History: Past Surgical History:  Procedure Laterality Date  . ABDOMINAL HYSTERECTOMY     Social History: Social History   Socioeconomic History  . Marital status: Single    Spouse name: Not on file  . Number of children: 1  . Years of education: 70  . Highest education level: 12th grade  Occupational History  . Occupation: Lawyer  Tobacco Use  . Smoking status: Never Smoker  . Smokeless tobacco: Never Used  Substance and Sexual Activity  . Alcohol use: Not Currently  . Drug use: No  . Sexual activity: Yes    Comment: works FT, doesn't regularly exercise  Other Topics Concern  . Not on file  Social History Narrative   Own candle in [place]   Social Determinants of Health   Financial Resource Strain: Low Risk   . Difficulty of Paying Living Expenses: Not hard at all  Food Insecurity: No Food Insecurity  . Worried About Programme researcher, broadcasting/film/video in the Last Year:  Never true  . Ran Out of Food in the Last Year: Never true  Transportation Needs: No Transportation Needs  . Lack of Transportation (Medical): No  . Lack of Transportation (Non-Medical): No  Physical Activity: Inactive  . Days of Exercise per Week: 0 days  . Minutes of Exercise per Session: 0 min  Stress: No Stress Concern Present  . Feeling of Stress : Not at all  Social Connections: Somewhat Isolated  . Frequency of Communication with Friends and Family: More than three times a week  . Frequency of Social Gatherings with Friends and Family: Once a week  . Attends Religious Services: More than 4 times per year  . Active Member of Clubs or Organizations: No  . Attends Banker Meetings: Never  . Marital Status: Divorced   Family History: Family History  Problem Relation Age of Onset  . Cancer Mother   . Hyperlipidemia Mother   . Hypertension Mother   . Alcohol abuse Father   . Cancer Other   . Heart attack Other   . Diabetes Other   . Stroke Other    Allergies: No Known Allergies Medications: See med rec.  Review of Systems: No fevers, chills, night sweats, weight loss, chest pain, or shortness of breath.   Objective:    General: Well Developed, well nourished.  Neuro: Alert and oriented x3.  HEENT: Normocephalic, atraumatic.  Skin: Warm and dry.  Mild erythema and edema of the right fifth finger from distal knuckle to tip of finger.  Skin very dry and peeling but no open wounds.  No drainage.  Fingernail lifted from approximately 75% of nailbed with proximal left corner still attached.  Cardiac: Regular rate and rhythm, no murmurs rubs or gallops, no lower extremity edema.  Respiratory: Clear to auscultation bilaterally. Not using accessory muscles, speaking in full sentences.   Impression and Recommendations:    Cellulitis of right little finger Keflex 500 mg 3 times daily x7 days.  Keep skin moisturized to help with itching.  Keep site covered,  especially at work.  Discussed full removal of nail, patient declined at this time.  Advised to trim loose nail back as far as possible and continue trimming as it grows out to prevent it from getting caught and torn.  Discussed infection risks associated with manicures.  Return if symptoms worsen or fail to improve.  ___________________________________________ Clearnce Sorrel, DNP, APRN, FNP-BC Primary Care and [place]

## 2019-10-14 NOTE — Telephone Encounter (Signed)
Pt was in the office this morning seeing Joy and stopped to talk to me about her knees.  She would like for you to go ahead and place a referral for replacement for her.  I advised her that you would probably send her to Dr. Luiz Blare and she is good with that.

## 2019-10-14 NOTE — Telephone Encounter (Signed)
Referral placed.

## 2019-10-14 NOTE — Assessment & Plan Note (Addendum)
Keflex 500 mg 3 times daily x7 days.  Keep skin moisturized to help with itching.  Keep site covered, especially at work.  Discussed full removal of nail, patient declined at this time.  Advised to trim loose nail back as far as possible and continue trimming as it grows out to prevent it from getting caught and torn.  Discussed infection risks associated with manicures.

## 2019-11-02 ENCOUNTER — Ambulatory Visit (INDEPENDENT_AMBULATORY_CARE_PROVIDER_SITE_OTHER): Payer: Medicare Other | Admitting: Sports Medicine

## 2019-11-02 ENCOUNTER — Other Ambulatory Visit: Payer: Self-pay

## 2019-11-02 DIAGNOSIS — M170 Bilateral primary osteoarthritis of knee: Secondary | ICD-10-CM | POA: Diagnosis not present

## 2019-11-02 NOTE — Progress Notes (Signed)
    Procedures performed today:    Procedure: Real-time Ultrasound Guided injection of the left knee Device: Samsung HS60  Verbal informed consent obtained.  Time-out conducted.  Noted no overlying erythema, induration, or other signs of local infection.  Skin prepped in a sterile fashion.  Local anesthesia: Topical Ethyl chloride.  With sterile technique and under real time ultrasound guidance: 1 cc Kenalog 40, 2 cc lidocaine, 2 cc bupivacaine injected easily Completed without difficulty  Pain immediately resolved suggesting accurate placement of the medication.  Advised to call if fevers/chills, erythema, induration, drainage, or persistent bleeding.  Images permanently stored and available for review in the ultrasound unit.  Impression: Technically successful ultrasound guided injection.  Procedure: Real-time Ultrasound Guided injection of the right knee Device: Samsung HS60  Verbal informed consent obtained.  Time-out conducted.  Noted no overlying erythema, induration, or other signs of local infection.  Skin prepped in a sterile fashion.  Local anesthesia: Topical Ethyl chloride.  With sterile technique and under real time ultrasound guidance: 1 cc Kenalog 40, 2 cc lidocaine, 2 cc bupivacaine injected easily Completed without difficulty  Pain immediately resolved suggesting accurate placement of the medication.  Advised to call if fevers/chills, erythema, induration, drainage, or persistent bleeding.  Images permanently stored and available for review in the ultrasound unit.  Impression: Technically successful ultrasound guided injection.  Independent interpretation of tests performed by another provider:   None.  Impression and Recommendations:    Primary osteoarthritis of both knees post genicular RFA Alison [name] returns, she is a pleasant 69 year old female with bilateral knee osteoarthritis, she has had several injections in the past, viscosupplementation, she had bilateral  genicular radiofrequency ablation back in the summertime of 2020, moderate relief. Having recurrence of pain, last steroid injection was in September last year. Repeat bilateral steroid injections today, we told her last time and we are telling her today that she really needs to proceed with arthroplasty. I have already referred her to Dr. Luiz Blare. Return as needed.    ___________________________________________ [name]. [name], M.D., ABFM., CAQSM. Primary Care and Sports Medicine [facility]  Adjunct Instructor of Family Medicine  [employer]

## 2019-11-02 NOTE — Assessment & Plan Note (Signed)
Alison [name] returns, she is a pleasant 69 year old female with bilateral knee osteoarthritis, she has had several injections in the past, viscosupplementation, she had bilateral genicular radiofrequency ablation back in the summertime of 2020, moderate relief. Having recurrence of pain, last steroid injection was in September last year. Repeat bilateral steroid injections today, we told her last time and we are telling her today that she really needs to proceed with arthroplasty. I have already referred her to Dr. Luiz Blare. Return as needed.

## 2020-01-05 ENCOUNTER — Other Ambulatory Visit: Payer: Self-pay | Admitting: *Deleted

## 2020-01-11 ENCOUNTER — Ambulatory Visit: Payer: Medicare Other | Admitting: Family Medicine

## 2020-01-19 ENCOUNTER — Ambulatory Visit (INDEPENDENT_AMBULATORY_CARE_PROVIDER_SITE_OTHER): Payer: Medicare Other | Admitting: Family Medicine

## 2020-01-19 ENCOUNTER — Encounter: Payer: Self-pay | Admitting: Family Medicine

## 2020-01-19 ENCOUNTER — Other Ambulatory Visit: Payer: Self-pay

## 2020-01-19 VITALS — BP 130/48 | HR 65 | Ht 61.0 in | Wt 173.0 lb

## 2020-01-19 DIAGNOSIS — I10 Essential (primary) hypertension: Secondary | ICD-10-CM

## 2020-01-19 DIAGNOSIS — B351 Tinea unguium: Secondary | ICD-10-CM | POA: Diagnosis not present

## 2020-01-19 DIAGNOSIS — R7301 Impaired fasting glucose: Secondary | ICD-10-CM

## 2020-01-19 DIAGNOSIS — F1010 Alcohol abuse, uncomplicated: Secondary | ICD-10-CM

## 2020-01-19 LAB — POCT GLYCOSYLATED HEMOGLOBIN (HGB A1C): Hemoglobin A1C: 5.4 % (ref 4.0–5.6)

## 2020-01-19 MED ORDER — METOPROLOL SUCCINATE ER 100 MG PO TB24: 100.0000 mg | ORAL_TABLET | Freq: Every day | ORAL | 1 refills | Status: DC

## 2020-01-19 MED ORDER — TERBINAFINE HCL 250 MG PO TABS: 250.0000 mg | ORAL_TABLET | Freq: Every day | ORAL | 1 refills | Status: DC

## 2020-01-19 NOTE — Assessment & Plan Note (Signed)
Blood pressure well controlled.  She is overdue for labs she was postcode last summer and never went.  Reprinted a new lab order today and encouraged her to go since she is fasting.

## 2020-01-19 NOTE — Assessment & Plan Note (Signed)
Recently started drinking some again but not daily.  In fact she reports that she did get a DUI in December.  We discussed working on cutting back we can certainly consider medication that could help her.  Or getting the additional support through a rehab program if needed.  She feels like she is doing well on her own.  Did discuss the importance of not drinking alcohol with the terbinafine for her nail.

## 2020-01-19 NOTE — Progress Notes (Signed)
Established Patient Office Visit  Subjective:  Patient ID: Alison [name], female    DOB: [birth year]  Age: 69 y.o. MRN: [id number]  CC:  Chief Complaint  Patient presents with  . Hypertension  . ifg    HPI Alison [name] presents for   Hypertension- Pt denies chest pain, SOB, dizziness, or heart palpitations.  Taking meds as directed w/o problems.  Denies medication side effects.    Impaired fasting glucose-no increased thirst or urination. No symptoms consistent with hypoglycemia.  She also wants me to look at her pinky finger on her left hand.  She said she got a nicked on the skin when she was getting her nails done 6 months ago or so.  She actually came in and was evaluated in January and given an antibiotic called Keflex to take for what was felt to be cellulitis at the time.  Patient reports that she never actually took the medication because it would require her to eat 3 times a day.  The nail has now fallen off completely.  And the fourth nail is starting to do the same thing.   Past Medical History:  Diagnosis Date  . Endometriosis     Past Surgical History:  Procedure Laterality Date  . ABDOMINAL HYSTERECTOMY      Family History  Problem Relation Age of Onset  . Cancer Mother   . Hyperlipidemia Mother   . Hypertension Mother   . Alcohol abuse Father   . Cancer Other   . Heart attack Other   . Diabetes Other   . Stroke Other     Social History   Socioeconomic History  . Marital status: Single    Spouse name: Not on file  . Number of children: 1  . Years of education: 47  . Highest education level: 12th grade  Occupational History  . Occupation: Lawyer  Tobacco Use  . Smoking status: Never Smoker  . Smokeless tobacco: Never Used  Substance and Sexual Activity  . Alcohol use: Not Currently  . Drug use: No  . Sexual activity: Yes    Comment: works FT, doesn't regularly exercise  Other Topics Concern  . 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 Difficulty of Paying Living Expenses: Not hard at all  Food Insecurity: No Food Insecurity  . Worried About Programme researcher, broadcasting/film/video in the Last Year: Never true  . Ran Out of Food in the Last Year: Never true  Transportation Needs: No Transportation Needs  . Lack of Transportation (Medical): No  . Lack of Transportation (Non-Medical): No  Physical Activity: Inactive  . Days of Exercise per Week: 0 days  . Minutes of Exercise per Session: 0 min  Stress: No Stress Concern Present  . Feeling of Stress : Not at all  Social Connections: Somewhat Isolated  . Frequency of Communication with Friends and Family: More than three times a week  . Frequency of Social Gatherings with Friends and Family: Once a week  . Attends Religious Services: More than 4 times per year  . Active Member of Clubs or Organizations: No  . Attends Banker Meetings: Never  . Marital Status: Divorced  Catering manager Violence: Not At Risk  . Fear of Current or Ex-Partner: No  . Emotionally Abused: No  . Physically Abused: No  . Sexually Abused: No  Outpatient Medications Prior to Visit  Medication Sig Dispense Refill  . esomeprazole (NEXIUM) 20 MG capsule Take by mouth.    . losartan-hydrochlorothiazide (HYZAAR) 100-12.5 MG tablet Take 1 tablet by mouth daily. 90 tablet 3  . metoprolol succinate (TOPROL-XL) 100 MG 24 hr tablet TAKE 1 TABLET BY MOUTH ONCE DAILY. TAKE WITH OR IMMEDIATELY FOLLOWING A MEAL. 30 tablet 3  . conjugated estrogens (PREMARIN) vaginal cream Place 1 Applicatorful vaginally daily. X 2 weeks, the decrease to twice a week (Patient not taking: Reported on 10/14/2019) 60 g 2   No facility-administered medications prior to visit.    No Known Allergies  ROS Review of Systems    Objective:    Physical Exam  Constitutional: She is oriented to person, place, and time.  She appears well-developed and well-nourished.  HENT:  Head: Normocephalic and atraumatic.  Cardiovascular: Normal rate, regular rhythm and normal heart sounds.  Pulmonary/Chest: Effort normal and breath sounds normal.  Neurological: She is alert and oriented to person, place, and time.  Skin: Skin is warm and dry.  She has lost her nail on her fifth digit and the fourth nail is thick and white and half of it looks like it is almost detached from the nail base.  Psychiatric: She has a normal mood and affect. Her behavior is normal.    BP (!) 130/48   Pulse 65   Ht 5\' 1"  (1.549 m)   Wt 173 lb (78.5 kg)   SpO2 100%   BMI 32.69 kg/m  Wt Readings from Last 3 Encounters:  01/19/20 173 lb (78.5 kg)  10/14/19 179 lb 0.6 oz (81.2 kg)  07/05/19 175 lb (79.4 kg)     There are no preventive care reminders to display for this patient.  There are no preventive care reminders to display for this patient.  Lab Results  Component Value Date   TSH 2.98 01/31/2016   Lab Results  Component Value Date   WBC 3.5 (L) 11/16/2012   HGB 13.7 11/16/2012   HCT 40.5 11/16/2012   MCV 99.0 11/16/2012   PLT 221 11/16/2012   Lab Results  Component Value Date   NA 139 01/31/2016   K 4.7 01/31/2016   CO2 26 01/31/2016   GLUCOSE 120 (H) 01/31/2016   BUN 14 01/31/2016   CREATININE 0.76 01/31/2016   BILITOT 0.8 01/31/2016   ALKPHOS 76 01/31/2016   AST 15 01/31/2016   ALT 10 01/31/2016   PROT 6.2 01/31/2016   ALBUMIN 3.6 01/31/2016   CALCIUM 8.9 01/31/2016   Lab Results  Component Value Date   CHOL 217 (H) 01/31/2016   Lab Results  Component Value Date   HDL 116 01/31/2016   Lab Results  Component Value Date   LDLCALC 88 01/31/2016   Lab Results  Component Value Date   TRIG 63 01/31/2016   Lab Results  Component Value Date   CHOLHDL 1.9 01/31/2016   Lab Results  Component Value Date   HGBA1C 5.4 01/19/2020      Assessment & Plan:   Problem List Items Addressed This  Visit      Cardiovascular and Mediastinum   HYPERTENSION, BENIGN - Primary    Blood pressure well controlled.  She is overdue for labs she was postcode last summer and never went.  Reprinted a new lab order today and encouraged her to go since she is fasting.      Relevant Medications   metoprolol succinate (TOPROL-XL) 100 MG 24 hr tablet  Other Relevant Orders   COMPLETE METABOLIC PANEL WITH GFR   Lipid panel   TSH     Endocrine   IFG (impaired fasting glucose)    A1c looks fantastic today at 5.4 is actually back in the normal range which is great.  We will plan to keep an eye on this and check again in 1 year.      Relevant Orders   POCT HgB A1C (Completed)   COMPLETE METABOLIC PANEL WITH GFR   Lipid panel   TSH     Other   Alcohol abuse    Recently started drinking some again but not daily.  In fact she reports that she did get a DUI in December.  We discussed working on cutting back we can certainly consider medication that could help her.  Or getting the additional support through a rehab program if needed.  She feels like she is doing well on her own.  Did discuss the importance of not drinking alcohol with the terbinafine for her nail.       Other Visit Diagnoses    Onychomycosis       Relevant Medications   terbinafine (LAMISIL) 250 MG tablet     Onychomycosis-she is already lost one fingernail completely and the nail beside it is almost attached.  We discussed treating with terbinafine.  But she cannot mix this with alcohol and she has to get baseline liver enzymes before starting the medication.  Meds ordered this encounter  Medications  . terbinafine (LAMISIL) 250 MG tablet    Sig: Take 1 tablet (250 mg total) by mouth daily.    Dispense:  90 tablet    Refill:  1  . metoprolol succinate (TOPROL-XL) 100 MG 24 hr tablet    Sig: Take 1 tablet (100 mg total) by mouth daily. Take with or immediately following a meal.    Dispense:  90 tablet    Refill:  1     Follow-up: Return in about 6 months (around 07/20/2020) for Hypertension.    Beatrice Lecher, MD

## 2020-01-19 NOTE — Assessment & Plan Note (Signed)
A1c looks fantastic today at 5.4 is actually back in the normal range which is great.  We will plan to keep an eye on this and check again in 1 year.

## 2020-01-20 LAB — COMPLETE METABOLIC PANEL WITH GFR
AG Ratio: 2.1 (calc) (ref 1.0–2.5)
ALT: 12 U/L (ref 6–29)
AST: 17 U/L (ref 10–35)
Albumin: 4.1 g/dL (ref 3.6–5.1)
Alkaline phosphatase (APISO): 76 U/L (ref 37–153)
BUN: 11 mg/dL (ref 7–25)
CO2: 29 mmol/L (ref 20–32)
Calcium: 9.1 mg/dL (ref 8.6–10.4)
Chloride: 89 mmol/L — ABNORMAL LOW (ref 98–110)
Creat: 0.93 mg/dL (ref 0.50–0.99)
GFR, Est African American: 73 mL/min/{1.73_m2} (ref >=60)
GFR, Est Non African American: 63 mL/min/{1.73_m2} (ref >=60)
Globulin: 2 g/dL (calc) (ref 1.9–3.7)
Glucose, Bld: 87 mg/dL (ref 65–99)
Potassium: 4.6 mmol/L (ref 3.5–5.3)
Sodium: 125 mmol/L — ABNORMAL LOW (ref 135–146)
Total Bilirubin: 0.4 mg/dL (ref 0.2–1.2)
Total Protein: 6.1 g/dL (ref 6.1–8.1)

## 2020-01-20 LAB — TSH: TSH: 0.94 mIU/L (ref 0.40–4.50)

## 2020-01-20 LAB — LIPID PANEL
Cholesterol: 208 mg/dL — ABNORMAL HIGH (ref <200)
HDL: 100 mg/dL (ref >=50)
LDL Cholesterol (Calc): 92 mg/dL (calc)
Non-HDL Cholesterol (Calc): 108 mg/dL (calc) (ref <130)
Total CHOL/HDL Ratio: 2.1 (calc) (ref <5.0)
Triglycerides: 75 mg/dL (ref <150)

## 2020-01-26 ENCOUNTER — Other Ambulatory Visit: Payer: Self-pay | Admitting: *Deleted

## 2020-01-26 DIAGNOSIS — E871 Hypo-osmolality and hyponatremia: Secondary | ICD-10-CM

## 2020-01-27 ENCOUNTER — Telehealth: Payer: Self-pay

## 2020-01-27 NOTE — Telephone Encounter (Signed)
Genoveva left a message stating the fungus infection in her finger/nails is painful and dry. She wanted to know what type of lotion/cream could she use to help the pain and dryness.

## 2020-01-27 NOTE — Telephone Encounter (Signed)
Make sure you are taking the oral Lamisil and in addition just recommend a really good heavy moisturizer such as Aquaphor.  It is a little bit greasy but it is really good.  There is also Cereve lotion which is good.  But something dye free perfume free which will irritate the skin.  Also avoid excessively washing hands.  Try to use more mild soaps that do not strip a lot of oils from the skin

## 2020-01-28 NOTE — Telephone Encounter (Signed)
Patient advised of recommendations.  

## 2020-02-02 DIAGNOSIS — R531 Weakness: Secondary | ICD-10-CM | POA: Diagnosis not present

## 2020-02-02 DIAGNOSIS — R319 Hematuria, unspecified: Secondary | ICD-10-CM | POA: Diagnosis not present

## 2020-02-02 DIAGNOSIS — N390 Urinary tract infection, site not specified: Secondary | ICD-10-CM | POA: Insufficient documentation

## 2020-02-02 DIAGNOSIS — R5381 Other malaise: Secondary | ICD-10-CM | POA: Insufficient documentation

## 2020-02-02 DIAGNOSIS — Z880 Allergy status to penicillin: Secondary | ICD-10-CM | POA: Diagnosis not present

## 2020-02-02 DIAGNOSIS — I10 Essential (primary) hypertension: Secondary | ICD-10-CM | POA: Diagnosis not present

## 2020-02-02 DIAGNOSIS — R5383 Other fatigue: Secondary | ICD-10-CM | POA: Diagnosis not present

## 2020-02-02 DIAGNOSIS — R21 Rash and other nonspecific skin eruption: Secondary | ICD-10-CM | POA: Diagnosis not present

## 2020-02-02 DIAGNOSIS — Z79899 Other long term (current) drug therapy: Secondary | ICD-10-CM | POA: Diagnosis not present

## 2020-02-02 DIAGNOSIS — E871 Hypo-osmolality and hyponatremia: Secondary | ICD-10-CM | POA: Diagnosis not present

## 2020-02-02 DIAGNOSIS — K219 Gastro-esophageal reflux disease without esophagitis: Secondary | ICD-10-CM | POA: Diagnosis not present

## 2020-02-02 DIAGNOSIS — Z20822 Contact with and (suspected) exposure to covid-19: Secondary | ICD-10-CM | POA: Diagnosis not present

## 2020-02-03 DIAGNOSIS — N390 Urinary tract infection, site not specified: Secondary | ICD-10-CM | POA: Diagnosis not present

## 2020-02-03 DIAGNOSIS — R5381 Other malaise: Secondary | ICD-10-CM | POA: Diagnosis not present

## 2020-02-03 DIAGNOSIS — R21 Rash and other nonspecific skin eruption: Secondary | ICD-10-CM | POA: Diagnosis not present

## 2020-02-03 DIAGNOSIS — E871 Hypo-osmolality and hyponatremia: Secondary | ICD-10-CM | POA: Diagnosis not present

## 2020-02-03 DIAGNOSIS — I10 Essential (primary) hypertension: Secondary | ICD-10-CM | POA: Diagnosis not present

## 2020-02-04 DIAGNOSIS — R5383 Other fatigue: Secondary | ICD-10-CM | POA: Diagnosis not present

## 2020-02-04 DIAGNOSIS — R21 Rash and other nonspecific skin eruption: Secondary | ICD-10-CM | POA: Diagnosis not present

## 2020-02-04 DIAGNOSIS — I10 Essential (primary) hypertension: Secondary | ICD-10-CM | POA: Diagnosis not present

## 2020-02-04 DIAGNOSIS — N390 Urinary tract infection, site not specified: Secondary | ICD-10-CM | POA: Diagnosis not present

## 2020-02-04 DIAGNOSIS — E871 Hypo-osmolality and hyponatremia: Secondary | ICD-10-CM | POA: Diagnosis not present

## 2020-02-04 DIAGNOSIS — R5381 Other malaise: Secondary | ICD-10-CM | POA: Diagnosis not present

## 2020-02-07 ENCOUNTER — Encounter: Payer: Self-pay | Admitting: Family Medicine

## 2020-02-07 ENCOUNTER — Other Ambulatory Visit: Payer: Self-pay

## 2020-02-07 ENCOUNTER — Other Ambulatory Visit: Payer: Self-pay | Admitting: *Deleted

## 2020-02-07 ENCOUNTER — Ambulatory Visit (INDEPENDENT_AMBULATORY_CARE_PROVIDER_SITE_OTHER): Payer: Medicare Other | Admitting: Family Medicine

## 2020-02-07 VITALS — BP 119/35 | HR 69 | Ht 61.0 in | Wt 175.0 lb

## 2020-02-07 DIAGNOSIS — I10 Essential (primary) hypertension: Secondary | ICD-10-CM | POA: Diagnosis not present

## 2020-02-07 DIAGNOSIS — E871 Hypo-osmolality and hyponatremia: Secondary | ICD-10-CM | POA: Diagnosis not present

## 2020-02-07 DIAGNOSIS — R21 Rash and other nonspecific skin eruption: Secondary | ICD-10-CM | POA: Diagnosis not present

## 2020-02-07 MED ORDER — LOSARTAN POTASSIUM 100 MG PO TABS: 100.0000 mg | ORAL_TABLET | Freq: Every day | ORAL | 0 refills | Status: DC

## 2020-02-07 NOTE — Patient Instructions (Signed)
See if you can borrow a friend's blood pressure cuff and take your blood pressure about 3 times a week when you have been able to sit and relax for at least 5 minutes before taking your pressure let me know what those numbers look like you can always send me a message through my chart and let see if they look really good and if we need to make any adjustments to your blood pressure regimen.

## 2020-02-07 NOTE — Progress Notes (Signed)
Hospital follow-up  Established Patient Office Visit  Subjective:  Patient ID: Alison [name], female    DOB: Feb 13, 1951  Age: 69 y.o. MRN: [id number]  CC:  Chief Complaint  Patient presents with  . Hospitalization Follow-up    HPI Alison [name] presents for hospital follow-up for hyponatremia as well as urinary tract infection.  She was admitted to [facility] on April 28 and discharged home 3 days later on April 30 for hyponatremia as well as UTI rash malaise and hypertension.  Did discontinue her hydrochlorothiazide upon discharge.  She had complained of dizziness sweatiness fever nausea and vomiting before she went to the ED. she also has a history of alcohol use but says she did not drink at all last week.  She was also treated for UTI .  He says she was actually asymptomatic.  She is feeling better overall.  She feels like the rash on her legs is looking a little bit better but wants to know what to do if it starts to come back.  She also wants to know if it is okay to get back into the tanning bed.   Past Medical History:  Diagnosis Date  . Endometriosis     Past Surgical History:  Procedure Laterality Date  . ABDOMINAL HYSTERECTOMY      Family History  Problem Relation Age of Onset  . Cancer Mother   . Hyperlipidemia Mother   . Hypertension Mother   . Alcohol abuse Father   . Cancer Other   . Heart attack Other   . Diabetes Other   . Stroke Other     Social History   Socioeconomic History  . Marital status: Single    Spouse name: Not on file  . Number of children: 1  . Years of education: 65  . Highest education level: 12th grade  Occupational History  . Occupation: Biomedical engineer  Tobacco Use  . Smoking status: Never Smoker  . Smokeless tobacco: Never Used  Substance and Sexual Activity  . Alcohol use: Not Currently  . Drug use: No  . Sexual activity: Yes    Comment: works FT, doesn't regularly exercise  Other Topics Concern  . Not on  file  Social History Narrative   Own candle in [place]   Social Determinants of Health   Financial Resource Strain: Low Risk   . Difficulty of Paying Living Expenses: Not hard at all  Food Insecurity: No Food Insecurity  . Worried About Charity fundraiser in the Last Year: Never true  . Ran Out of Food in the Last Year: Never true  Transportation Needs: No Transportation Needs  . Lack of Transportation (Medical): No  . Lack of Transportation (Non-Medical): No  Physical Activity: Inactive  . Days of Exercise per Week: 0 days  . Minutes of Exercise per Session: 0 min  Stress: No Stress Concern Present  . Feeling of Stress : Not at all  Social Connections: Somewhat Isolated  . Frequency of Communication with Friends and Family: More than three times a week  . Frequency of Social Gatherings with Friends and Family: Once a week  . Attends Religious Services: More than 4 times per year  . Active Member of Clubs or Organizations: No  . Attends Archivist Meetings: Never  . Marital Status: Divorced  Human resources officer Violence: Not At Risk  . Fear of Current or Ex-Partner: No  . Emotionally Abused: No  . Physically Abused: No  . Sexually  Abused: No    Outpatient Medications Prior to Visit  Medication Sig Dispense Refill  . losartan (COZAAR) 100 MG tablet Take 100 mg by mouth daily.    Marland Kitchen esomeprazole (NEXIUM) 20 MG capsule Take by mouth.    . metoprolol succinate (TOPROL-XL) 100 MG 24 hr tablet Take 1 tablet (100 mg total) by mouth daily. Take with or immediately following a meal. 90 tablet 1  . losartan-hydrochlorothiazide (HYZAAR) 100-12.5 MG tablet Take 1 tablet by mouth daily. 90 tablet 3  . terbinafine (LAMISIL) 250 MG tablet Take 1 tablet (250 mg total) by mouth daily. 90 tablet 1   No facility-administered medications prior to visit.    No Known Allergies  ROS Review of Systems    Objective:    Physical Exam  Constitutional: She is oriented to person,  place, and time. She appears well-developed and well-nourished.  HENT:  Head: Normocephalic and atraumatic.  Cardiovascular: Normal rate, regular rhythm and normal heart sounds.  Pulmonary/Chest: Effort normal and breath sounds normal.  Neurological: She is alert and oriented to person, place, and time.  Skin: Skin is warm and dry.  Have a few just pale pink erythematous circular lesions measuring approximately 2 to 3 cm each scattered on her lower legs.  They look like they are healing.  Psychiatric: She has a normal mood and affect. Her behavior is normal.    BP (!) 119/35   Pulse 69   Ht 5\' 1"  (1.549 m)   Wt 175 lb (79.4 kg)   SpO2 100%   BMI 33.07 kg/m  Wt Readings from Last 3 Encounters:  02/07/20 175 lb (79.4 kg)  01/19/20 173 lb (78.5 kg)  10/14/19 179 lb 0.6 oz (81.2 kg)     There are no preventive care reminders to display for this patient.  There are no preventive care reminders to display for this patient.  Lab Results  Component Value Date   TSH 0.94 01/19/2020   Lab Results  Component Value Date   WBC 3.5 (L) 11/16/2012   HGB 13.7 11/16/2012   HCT 40.5 11/16/2012   MCV 99.0 11/16/2012   PLT 221 11/16/2012   Lab Results  Component Value Date   NA 125 (L) 01/19/2020   K 4.6 01/19/2020   CO2 29 01/19/2020   GLUCOSE 87 01/19/2020   BUN 11 01/19/2020   CREATININE 0.93 01/19/2020   BILITOT 0.4 01/19/2020   ALKPHOS 76 01/31/2016   AST 17 01/19/2020   ALT 12 01/19/2020   PROT 6.1 01/19/2020   ALBUMIN 3.6 01/31/2016   CALCIUM 9.1 01/19/2020   Lab Results  Component Value Date   CHOL 208 (H) 01/19/2020   Lab Results  Component Value Date   HDL 100 01/19/2020   Lab Results  Component Value Date   LDLCALC 92 01/19/2020   Lab Results  Component Value Date   TRIG 75 01/19/2020   Lab Results  Component Value Date   CHOLHDL 2.1 01/19/2020   Lab Results  Component Value Date   HGBA1C 5.4 01/19/2020      Assessment & Plan:   Problem List  Items Addressed This Visit      Cardiovascular and Mediastinum   HYPERTENSION, BENIGN    Well controlled off the HCTZ.  Continue current regimen. Follow up in  6 months      Relevant Medications   losartan (COZAAR) 100 MG tablet   Other Relevant Orders   BASIC METABOLIC PANEL WITH GFR    Other Visit  Diagnoses    Hyponatremia    -  Primary   Rash         Hyponatremia-due to recheck labs she did not want to go today so I encouraged her to go later this week so that we can make sure that everything is stabilized off of the hydrochlorothiazide though I do think her alcohol use has contributed as well.  RAsh --Improving.  If it suddenly gets worse then please let us know.  Could be a side effect from terbinafine which we had just started.  RPR was negative.  CRP was mildly elevated but sed rate was normal.  ANA was negative.  Protein level was mildly elevated but 24-hour protein was normal.  No orders of the defined types were placed in this encounter.   Follow-up: Return in about 2 months (around 04/08/2020) for Hypertension and sodium.    Nani Gasser, MD

## 2020-02-07 NOTE — Assessment & Plan Note (Signed)
Well controlled off the HCTZ.  Continue current regimen. Follow up in  6 months

## 2020-02-10 ENCOUNTER — Telehealth: Payer: Self-pay

## 2020-02-10 MED ORDER — FLUCONAZOLE 150 MG PO TABS
150.0000 mg | ORAL_TABLET | Freq: Once | ORAL | 0 refills | Status: AC
Start: 2020-02-10 — End: 2020-02-10

## 2020-02-10 NOTE — Telephone Encounter (Signed)
Okay, new prescription sent to pharmacy for Diflucan.  If she is not better after the weekend then she will need to make an appointment.

## 2020-02-10 NOTE — Telephone Encounter (Signed)
She states she has a yeast infection. She reports vaginal itching. Denies pelvic pain, discharge, fever, chills or sweats. She has tried Monistat OTC. She was on antibiotics during her recent hospital visit.

## 2020-02-11 NOTE — Telephone Encounter (Signed)
Left a message advising of recommendations.  

## 2020-02-18 ENCOUNTER — Other Ambulatory Visit: Payer: Self-pay

## 2020-02-18 ENCOUNTER — Telehealth: Payer: Self-pay | Admitting: Family Medicine

## 2020-02-18 ENCOUNTER — Ambulatory Visit (INDEPENDENT_AMBULATORY_CARE_PROVIDER_SITE_OTHER): Payer: Medicare Other | Admitting: Sports Medicine

## 2020-02-18 DIAGNOSIS — M170 Bilateral primary osteoarthritis of knee: Secondary | ICD-10-CM

## 2020-02-18 NOTE — Progress Notes (Signed)
    Procedures performed today:    Procedure: Real-time Ultrasound Guided injection of the left knee Device: Samsung HS60  Verbal informed consent obtained.  Time-out conducted.  Noted no overlying erythema, induration, or other signs of local infection.  Skin prepped in a sterile fashion.  Local anesthesia: Topical Ethyl chloride.  With sterile technique and under real time ultrasound guidance: 1 cc Kenalog 40, 2 cc lidocaine, 2 cc bupivacaine injected easily Completed without difficulty  Pain immediately resolved suggesting accurate placement of the medication.  Advised to call if fevers/chills, erythema, induration, drainage, or persistent bleeding.  Images permanently stored and available for review in the ultrasound unit.  Impression: Technically successful ultrasound guided injection.  Procedure: Real-time Ultrasound Guided injection of the right knee Device: Samsung HS60  Verbal informed consent obtained.  Time-out conducted.  Noted no overlying erythema, induration, or other signs of local infection.  Skin prepped in a sterile fashion.  Local anesthesia: Topical Ethyl chloride.  With sterile technique and under real time ultrasound guidance: 1 cc Kenalog 40, 2 cc lidocaine, 2 cc bupivacaine injected easily Completed without difficulty  Pain immediately resolved suggesting accurate placement of the medication.  Advised to call if fevers/chills, erythema, induration, drainage, or persistent bleeding.  Images permanently stored and available for review in the ultrasound unit.  Impression: Technically successful ultrasound guided injection.  Independent interpretation of notes and tests performed by another provider:   None.  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 She did have a recurrence of pain, her last steroid injection was in January 2021 now with recurrence of discomfort again. She has been referred to Dr. Luiz Blare, she needs an arthroplasty, she is here for repeat injections understand she will need to wait at least 6 to 12 weeks before she can have the knee replacement after injections.    ___________________________________________ [name]. [name], M.D., ABFM., CAQSM. Primary Care and Sports Medicine [facility]  Adjunct Instructor of Family Medicine  [employer]

## 2020-02-18 NOTE — Assessment & Plan Note (Signed)
Alison [name] returns, she is a pleasant 69 year old female with bilateral knee osteoarthritis, post several injections, viscosupplementation, bilateral genicular radiofrequency ablation in December 2020, moderate relief. She did have a recurrence of pain, her last steroid injection was in January 2021 now with recurrence of discomfort again. She has been referred to Dr. Luiz Blare, she needs an arthroplasty, she is here for repeat injections understand she will need to wait at least 6 to 12 weeks before she can have the knee replacement after injections.

## 2020-02-18 NOTE — Telephone Encounter (Signed)
Call pt: I know she doesn't want to do the scope for colon cancer screening but is she willing to do the yearly stool cards for screening or Cologuard? 

## 2020-02-21 NOTE — Telephone Encounter (Signed)
Sending to assistant  

## 2020-02-29 NOTE — Telephone Encounter (Signed)
LVM asking that pt rtn call about whether or not she would like to do cologuard or stool cards.

## 2020-03-01 NOTE — Telephone Encounter (Signed)
LVM  Asking that she either call or send my chart about colon ca screening.

## 2020-03-08 ENCOUNTER — Encounter: Payer: Self-pay | Admitting: *Deleted

## 2020-03-08 NOTE — Telephone Encounter (Signed)
Letter sent to pt

## 2020-04-03 ENCOUNTER — Other Ambulatory Visit: Payer: Self-pay | Admitting: Family Medicine

## 2020-04-07 DIAGNOSIS — I10 Essential (primary) hypertension: Secondary | ICD-10-CM | POA: Diagnosis not present

## 2020-04-07 DIAGNOSIS — S0990XA Unspecified injury of head, initial encounter: Secondary | ICD-10-CM | POA: Diagnosis not present

## 2020-04-07 DIAGNOSIS — S20212A Contusion of left front wall of thorax, initial encounter: Secondary | ICD-10-CM | POA: Diagnosis not present

## 2020-04-07 DIAGNOSIS — J90 Pleural effusion, not elsewhere classified: Secondary | ICD-10-CM | POA: Diagnosis not present

## 2020-04-07 DIAGNOSIS — K219 Gastro-esophageal reflux disease without esophagitis: Secondary | ICD-10-CM | POA: Diagnosis not present

## 2020-04-07 DIAGNOSIS — R519 Headache, unspecified: Secondary | ICD-10-CM | POA: Diagnosis not present

## 2020-04-07 DIAGNOSIS — Z79899 Other long term (current) drug therapy: Secondary | ICD-10-CM | POA: Diagnosis not present

## 2020-04-07 DIAGNOSIS — J0100 Acute maxillary sinusitis, unspecified: Secondary | ICD-10-CM | POA: Diagnosis not present

## 2020-04-07 DIAGNOSIS — Z880 Allergy status to penicillin: Secondary | ICD-10-CM | POA: Diagnosis not present

## 2020-04-07 DIAGNOSIS — G44319 Acute post-traumatic headache, not intractable: Secondary | ICD-10-CM | POA: Diagnosis not present

## 2020-04-07 DIAGNOSIS — S299XXA Unspecified injury of thorax, initial encounter: Secondary | ICD-10-CM | POA: Diagnosis not present

## 2020-04-17 ENCOUNTER — Encounter: Payer: Self-pay | Admitting: Physician Assistant

## 2020-04-17 ENCOUNTER — Ambulatory Visit (INDEPENDENT_AMBULATORY_CARE_PROVIDER_SITE_OTHER): Payer: Medicare Other | Admitting: Physician Assistant

## 2020-04-17 VITALS — Ht 61.0 in | Wt 167.0 lb

## 2020-04-17 DIAGNOSIS — W19XXXA Unspecified fall, initial encounter: Secondary | ICD-10-CM | POA: Insufficient documentation

## 2020-04-17 DIAGNOSIS — I951 Orthostatic hypotension: Secondary | ICD-10-CM | POA: Diagnosis not present

## 2020-04-17 DIAGNOSIS — F0781 Postconcussional syndrome: Secondary | ICD-10-CM

## 2020-04-17 DIAGNOSIS — R410 Disorientation, unspecified: Secondary | ICD-10-CM | POA: Diagnosis not present

## 2020-04-17 DIAGNOSIS — M170 Bilateral primary osteoarthritis of knee: Secondary | ICD-10-CM

## 2020-04-17 DIAGNOSIS — N179 Acute kidney failure, unspecified: Secondary | ICD-10-CM

## 2020-04-17 DIAGNOSIS — J320 Chronic maxillary sinusitis: Secondary | ICD-10-CM

## 2020-04-17 DIAGNOSIS — R944 Abnormal results of kidney function studies: Secondary | ICD-10-CM

## 2020-04-17 DIAGNOSIS — W19XXXD Unspecified fall, subsequent encounter: Secondary | ICD-10-CM

## 2020-04-17 DIAGNOSIS — F1011 Alcohol abuse, in remission: Secondary | ICD-10-CM

## 2020-04-17 LAB — POCT URINALYSIS DIP (CLINITEK)
Blood, UA: NEGATIVE
Glucose, UA: NEGATIVE mg/dL
Nitrite, UA: NEGATIVE
POC PROTEIN,UA: 30 — AB
Spec Grav, UA: 1.025 (ref 1.010–1.025)
Urobilinogen, UA: 0.2 E.U./dL
pH, UA: 5 (ref 5.0–8.0)

## 2020-04-17 MED ORDER — MELOXICAM 15 MG PO TABS: 15.0000 mg | ORAL_TABLET | Freq: Every day | ORAL | 1 refills | Status: DC

## 2020-04-17 MED ORDER — AMOXICILLIN-POT CLAVULANATE 875-125 MG PO TABS: 1.0000 | ORAL_TABLET | Freq: Two times a day (BID) | ORAL | 0 refills | Status: DC

## 2020-04-17 NOTE — Progress Notes (Addendum)
Subjective:    Patient ID: Alison [name], female    DOB: 11-22-1950, 69 y.o.   MRN: [id number]  HPI  Pt is a 69 yo female with HTN, osteoarthritis, GERD and history of hyponatremia who presents to the clinic to follow up after fall on 04/07/2020. She was on the 3rd step and lost her balance and fell backward and hit her left parietal head on the concrete.   She went to ED. CT negative for mass or bleeding but did show left maxillary sinusitis that was not treated. CXR/rib xray no acute findings. She was given a few norco and sent home. No labs were done.   Pt does not remember feeling dizzy that day. She does not remember drinking alcohol. She has a lot of OA knee and back pain and not always stable on her feet. Per patient her boyfriend found her and "held her until she woke up but it was at least 2 hours".   She continues to feel weak, tired, confused and has dull headache. She is having trouble remembering things. She feels very dizzy especially with position changes.   She questions what can she take for her arthritis pain that she has. celebrex did not work.   .. Active Ambulatory Problems    Diagnosis Date Noted  . HYPERTENSION, BENIGN 05/30/2008  . GERD 05/30/2008  . Alcohol abuse 11/29/2013  . Depression 11/29/2013  . 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 IFG (impaired fasting glucose) 09/09/2016  . Duodenal bulb ulcer with perforation (HCC) 02/20/2017  . Elevated LFTs 04/22/2017  . Obesity (BMI 30.0-34.9) 07/29/2016  . Orthostasis 07/29/2016  . Dislocation of finger PIP joint 09/14/2018  . Lumbar spinal stenosis 03/16/2019  . Dyspareunia, female 07/05/2019  . Atrophic vaginitis 07/05/2019  . Post concussion syndrome 04/17/2020  . Orthostatic hypotension 04/17/2020  . Left maxillary sinusitis 04/17/2020  . Fall 04/17/2020  . Disorientation 04/18/2020   Resolved Ambulatory Problems    Diagnosis Date Noted  . ALCOHOL USE 05/30/2008  .  OSTEOARTHRITIS, KNEE 08/26/2008  . LEG PAIN, BILATERAL 06/14/2008  . CHEST PAIN, ACUTE 10/17/2008  . HTN (hypertension) 11/29/2013  . Gastroenteritis 07/29/2016  . Cellulitis of right little finger 10/14/2019   Past Medical History:  Diagnosis Date  . Endometriosis       Review of Systems See HPI.     Objective:   Physical Exam Vitals reviewed.  Constitutional:      Appearance: Normal appearance.  HENT:     Head:     Comments: Small lump on left parietal lobe. Slightly tender.    Right Ear: Tympanic membrane normal.     Left Ear: Tympanic membrane normal.     Nose: Congestion present.  Eyes:     Extraocular Movements: Extraocular movements intact.     Conjunctiva/sclera: Conjunctivae normal.     Pupils: Pupils are equal, round, and reactive to light.  Cardiovascular:     Rate and Rhythm: Normal rate and regular rhythm.     Pulses: Normal pulses.  Pulmonary:     Effort: Pulmonary effort is normal.     Breath sounds: Normal breath sounds. No wheezing or rhonchi.  Musculoskeletal:     Right lower leg: No edema.     Left lower leg: No edema.  Neurological:     Mental Status: She is alert. She is disoriented.     Motor: Weakness present.     Coordination: Coordination abnormal.     Gait:  Gait normal.     Comments: Not able to perform Dix-hallpike.           Assessment & Plan:  Marland KitchenMarland KitchenCaitrin was seen today for fall.  Diagnoses and all orders for this visit:  Fall, subsequent encounter -     COMPLETE METABOLIC PANEL WITH GFR -     CBC with Differential/Platelet  Orthostatic hypotension -     COMPLETE METABOLIC PANEL WITH GFR -     CBC with Differential/Platelet  Post concussion syndrome -     COMPLETE METABOLIC PANEL WITH GFR -     CBC with Differential/Platelet -     POCT URINALYSIS DIP (CLINITEK) -     Urine Culture  Left maxillary sinusitis -     amoxicillin-clavulanate (AUGMENTIN) 875-125 MG tablet; Take 1 tablet by mouth 2 (two) times daily for 10  days.  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  Disorientation -     Urine Culture   UA shows bilirubin/protein/leukocytes. Will culture. Pt denies any urinary symptoms today.   BP readings confirm orthostatic hypotension. Discussed this with patient. HO given. Increase hydration and salt. Will get labs.   Concerned about if there was a reason why patient fell to be found in blood work. Will get labs. Pt denies any "significant" alcohol use before fall.   Could be some symptoms of post concussion syndrome if all labs are normal. Unclear what role sinusitis plays in this but seen on CT in ED. Will treat today.   For OA pain gave mobic. Last GFR/serum creatinine in normal range. No concerns right now.

## 2020-04-17 NOTE — Patient Instructions (Addendum)
1. Start antibiotic for sinus infection.  2. Increase your hydration and salt to help blood pressure.  3. Watch any sudden movements.  4. Brain rest for the next 10 days.   Orthostatic Hypotension Blood pressure is a measurement of how strongly, or weakly, your blood is pressing against the walls of your arteries. Orthostatic hypotension is a sudden drop in blood pressure that happens when you quickly change positions, such as when you get up from sitting or lying down. Arteries are blood vessels that carry blood from your heart throughout your body. When blood pressure is too low, you may not get enough blood to your brain or to the rest of your organs. This can cause weakness, light-headedness, rapid heartbeat, and fainting. This can last for just a few seconds or for up to a few minutes. Orthostatic hypotension is usually not a serious problem. However, if it happens frequently or gets worse, it may be a sign of something more serious. What are the causes? This condition may be caused by:  Sudden changes in posture, such as standing up quickly after you have been sitting or lying down.  Blood loss.  Loss of body fluids (dehydration).  Heart problems.  Hormone (endocrine) problems.  Pregnancy.  Severe infection.  Lack of certain nutrients.  Severe allergic reactions (anaphylaxis).  Certain medicines, such as blood pressure medicine or medicines that make the body lose excess fluids (diuretics). Sometimes, this condition can be caused by not taking medicine as directed, such as taking too much of a certain medicine. What increases the risk? The following factors may make you more likely to develop this condition:  Age. Risk increases as you get older.  Conditions that affect the heart or the central nervous system.  Taking certain medicines, such as blood pressure medicine or diuretics.  Being pregnant. What are the signs or symptoms? Symptoms of this condition may  include:  Weakness.  Light-headedness.  Dizziness.  Blurred vision.  Fatigue.  Rapid heartbeat.  Fainting, in severe cases. How is this diagnosed? This condition is diagnosed based on:  Your medical history.  Your symptoms.  Your blood pressure measurement. Your health care provider will check your blood pressure when you are: ? Lying down. ? Sitting. ? Standing. A blood pressure reading is recorded as two numbers, such as "120 over 80" (or 120/80). The first ("top") number is called the systolic pressure. It is a measure of the pressure in your arteries as your heart beats. The second ("bottom") number is called the diastolic pressure. It is a measure of the pressure in your arteries when your heart relaxes between beats. Blood pressure is measured in a unit called mm Hg. Healthy blood pressure for most adults is 120/80. If your blood pressure is below 90/60, you may be diagnosed with hypotension. Other information or tests that may be used to diagnose orthostatic hypotension include:  Your other vital signs, such as your heart rate and temperature.  Blood tests.  Tilt table test. For this test, you will be safely secured to a table that moves you from a lying position to an upright position. Your heart rhythm and blood pressure will be monitored during the test. How is this treated? This condition may be treated by:  Changing your diet. This may involve eating more salt (sodium) or drinking more water.  Taking medicines to raise your blood pressure.  Changing the dosage of certain medicines you are taking that might be lowering your blood pressure.  Wearing  compression stockings. These stockings help to prevent blood clots and reduce swelling in your legs. In some cases, you may need to go to the hospital for:  Fluid replacement. This means you will receive fluids through an IV.  Blood replacement. This means you will receive donated blood through an IV  (transfusion).  Treating an infection or heart problems, if this applies.  Monitoring. You may need to be monitored while medicines that you are taking wear off. Follow these instructions at home: Eating and drinking   Drink enough fluid to keep your urine pale yellow.  Eat a healthy diet, and follow instructions from your health care provider about eating or drinking restrictions. A healthy diet includes: ? Fresh fruits and vegetables. ? Whole grains. ? Lean meats. ? Low-fat dairy products.  Eat extra salt only as directed. Do not add extra salt to your diet unless your health care provider told you to do that.  Eat frequent, small meals.  Avoid standing up suddenly after eating. Medicines  Take over-the-counter and prescription medicines only as told by your health care provider. ? Follow instructions from your health care provider about changing the dosage of your current medicines, if this applies. ? Do not stop or adjust any of your medicines on your own. General instructions   Wear compression stockings as told by your health care provider.  Get up slowly from lying down or sitting positions. This gives your blood pressure a chance to adjust.  Avoid hot showers and excessive heat as directed by your health care provider.  Return to your normal activities as told by your health care provider. Ask your health care provider what activities are safe for you.  Do not use any products that contain nicotine or tobacco, such as cigarettes, e-cigarettes, and chewing tobacco. If you need help quitting, ask your health care provider.  Keep all follow-up visits as told by your health care provider. This is important. Contact a health care provider if you:  Vomit.  Have diarrhea.  Have a fever for more than 2-3 days.  Feel more thirsty than usual.  Feel weak and tired. Get help right away if you:  Have chest pain.  Have a fast or irregular heartbeat.  Develop  numbness in any part of your body.  Cannot move your arms or your legs.  Have trouble speaking.  Become sweaty or feel light-headed.  Faint.  Feel short of breath.  Have trouble staying awake.  Feel confused. Summary  Orthostatic hypotension is a sudden drop in blood pressure that happens when you quickly change positions.  Orthostatic hypotension is usually not a serious problem.  It is diagnosed by having your blood pressure taken lying down, sitting, and then standing.  It may be treated by changing your diet or adjusting your medicines. This information is not intended to replace advice given to you by your health care provider. Make sure you discuss any questions you have with your health care provider. Document Revised: 03/19/2018 Document Reviewed: 03/19/2018 Elsevier Patient Education  2020 [employer] Inc[employer]   Post-Concussion Syndrome  Post-concussion syndrome is when symptoms last longer than normal after a head injury. What are the signs or symptoms? After a head injury, you may:  Have headaches.  Feel tired.  Feel dizzy.  Feel weak.  Have trouble seeing.  Have trouble in bright lights.  Have trouble hearing.  Not be able to remember things.  Not be able to focus.  Have trouble sleeping.  Have mood  swings.  Have trouble learning new things. These can last from weeks to months. Follow these instructions at home: Medicines  Take all medicines only as told by your doctor.  Do not take prescription pain medicines. Activity  Limit activities as told by your doctor. This includes: ? Homework. ? Job-related work. ? Thinking. ? Watching TV. ? Using a computer or phone. ? Puzzles. ? Exercise. ? Sports.  Slowly return to your normal activity as told by your doctor.  Stop an activity if you have symptoms.  Do not do anything that may cause you to get injured again. General instructions  Rest. Try to: ? Sleep 7-9 hours each  night. ? Take naps or breaks when you feel tired during the day.  Do not drink alcohol until your doctor says that you can.  Keep track of your symptoms.  Keep all follow-up visits as told by your doctor. This is important. Contact a doctor if:  You do not improve.  You get worse.  You have another injury. Get help right away if:  You have a very bad headache.  You feel confused.  You feel very sleepy.  You pass out (faint).  You throw up (vomit).  You feel weak in any part of your body.  You feel numb in any part of your body.  You start shaking (have a seizure).  You have trouble talking. Summary  Post-concussion syndrome is when symptoms last longer than normal after a head injury.  Limit all activity after your injury. Gradually return to normal activity as told by your doctor.  Rest, do not drink alcohol, and avoid prescription pain medicines after a concussion.  Call your doctor if your symptoms get worse. This information is not intended to replace advice given to you by your health care provider. Make sure you discuss any questions you have with your health care provider. Document Revised: 07/16/2018 Document Reviewed: 10/28/2017 Elsevier Patient Education  2020 [employer].

## 2020-04-18 ENCOUNTER — Other Ambulatory Visit: Payer: Self-pay | Admitting: Physician Assistant

## 2020-04-18 ENCOUNTER — Telehealth: Payer: Self-pay | Admitting: Physician Assistant

## 2020-04-18 LAB — CBC WITH DIFFERENTIAL/PLATELET
Absolute Monocytes: 483 cells/uL (ref 200–950)
Basophils Absolute: 43 cells/uL (ref 0–200)
Basophils Relative: 0.6 %
Eosinophils Absolute: 28 cells/uL (ref 15–500)
Eosinophils Relative: 0.4 %
HCT: 38.6 % (ref 35.0–45.0)
Hemoglobin: 13.6 g/dL (ref 11.7–15.5)
Lymphs Abs: 1235 cells/uL (ref 850–3900)
MCH: 37.9 pg — ABNORMAL HIGH (ref 27.0–33.0)
MCHC: 35.2 g/dL (ref 32.0–36.0)
MCV: 107.5 fL — ABNORMAL HIGH (ref 80.0–100.0)
MPV: 10.3 fL (ref 7.5–12.5)
Monocytes Relative: 6.8 %
Neutro Abs: 5311 cells/uL (ref 1500–7800)
Neutrophils Relative %: 74.8 %
Platelets: 315 10*3/uL (ref 140–400)
RBC: 3.59 10*6/uL — ABNORMAL LOW (ref 3.80–5.10)
RDW: 13 % (ref 11.0–15.0)
Total Lymphocyte: 17.4 %
WBC: 7.1 10*3/uL (ref 3.8–10.8)

## 2020-04-18 LAB — COMPLETE METABOLIC PANEL WITH GFR
AG Ratio: 2 (calc) (ref 1.0–2.5)
ALT: 11 U/L (ref 6–29)
AST: 16 U/L (ref 10–35)
Albumin: 4.3 g/dL (ref 3.6–5.1)
Alkaline phosphatase (APISO): 119 U/L (ref 37–153)
BUN/Creatinine Ratio: 11 (calc) (ref 6–22)
BUN: 32 mg/dL — ABNORMAL HIGH (ref 7–25)
CO2: 25 mmol/L (ref 20–32)
Calcium: 9.7 mg/dL (ref 8.6–10.4)
Chloride: 96 mmol/L — ABNORMAL LOW (ref 98–110)
Creat: 2.83 mg/dL — ABNORMAL HIGH (ref 0.50–0.99)
GFR, Est African American: 19 mL/min/{1.73_m2} — ABNORMAL LOW (ref >=60)
GFR, Est Non African American: 16 mL/min/{1.73_m2} — ABNORMAL LOW (ref >=60)
Globulin: 2.1 g/dL (calc) (ref 1.9–3.7)
Glucose, Bld: 128 mg/dL (ref 65–139)
Potassium: 4.5 mmol/L (ref 3.5–5.3)
Sodium: 132 mmol/L — ABNORMAL LOW (ref 135–146)
Total Bilirubin: 0.9 mg/dL (ref 0.2–1.2)
Total Protein: 6.4 g/dL (ref 6.1–8.1)

## 2020-04-18 LAB — URINE CULTURE
MICRO NUMBER:: 10694759
SPECIMEN QUALITY:: ADEQUATE

## 2020-04-18 MED ORDER — AMOXICILLIN-POT CLAVULANATE 500-125 MG PO TABS
1.0000 | ORAL_TABLET | Freq: Two times a day (BID) | ORAL | 0 refills | Status: DC
Start: 2020-04-18 — End: 2020-05-01

## 2020-04-18 NOTE — Addendum Note (Signed)
Addended byJomarie Longs on: 04/18/2020 12:22 PM   Modules accepted: Orders

## 2020-04-18 NOTE — Progress Notes (Signed)
Patient ID: Alison [name], female   DOB: Oct 13, 1950, 69 y.o.   MRN: [id number] Consulted PCP.  Ordered renal U/S.  Referral for nephrology made.  Recheck labs later this week.

## 2020-04-18 NOTE — Progress Notes (Signed)
Alison [name],   Your kidney function has dropped drastically from 3 months ago. This could be the reason for why you feel so disoriented. The reason why is still unclear. You have not started any new medications or taking any daily over the counter medications?   Your sodium had improved.   STOP mobic and augmentin that was given yesterday. Your kidneys cannot handle that. I will send over safer dose of augmentin.   Creatine Clearance 23.   I will also consult PCP for next steps in management of acute kidney injury.

## 2020-04-19 NOTE — Progress Notes (Signed)
Likely contamination but no significant bacteria growth.   Make sure pt aware: No NSAIDS. Stop mobic.  Take new dose of augmentin sent to pharmacy.  Nephrology referral made.  [place] of renal made Repeat labs at end of this week See PcP next week

## 2020-04-20 ENCOUNTER — Ambulatory Visit (HOSPITAL_BASED_OUTPATIENT_CLINIC_OR_DEPARTMENT_OTHER): Payer: Medicare Other

## 2020-04-25 ENCOUNTER — Ambulatory Visit (HOSPITAL_BASED_OUTPATIENT_CLINIC_OR_DEPARTMENT_OTHER): Payer: Medicare Other

## 2020-04-26 MED ORDER — FLUCONAZOLE 150 MG PO TABS
150.0000 mg | ORAL_TABLET | Freq: Once | ORAL | 0 refills | Status: DC
Start: 2020-04-26 — End: 2020-04-26

## 2020-04-26 MED ORDER — FLUCONAZOLE 150 MG PO TABS: 150.0000 mg | ORAL_TABLET | Freq: Once | ORAL | 0 refills | Status: DC

## 2020-04-26 NOTE — Telephone Encounter (Signed)
She did have abx. Sent diflucan.

## 2020-04-26 NOTE — Telephone Encounter (Signed)
Patient reports that she seen you recently for a visit and was given an ABT and is having some itching that feels like a yeast infection. She reports this has been going on for a few days. She reports she has had this in the past. [facility] on file is correct. Please advise.

## 2020-04-26 NOTE — Addendum Note (Signed)
Addended bySilvio Pate on: 04/26/2020 10:06 AM   Modules accepted: Orders

## 2020-04-26 NOTE — Telephone Encounter (Signed)
I called patient and she is aware that medication was sent to the pharmacy. No other concerns.

## 2020-05-01 ENCOUNTER — Ambulatory Visit (INDEPENDENT_AMBULATORY_CARE_PROVIDER_SITE_OTHER): Payer: Medicare Other | Admitting: Family Medicine

## 2020-05-01 ENCOUNTER — Encounter: Payer: Self-pay | Admitting: Family Medicine

## 2020-05-01 VITALS — BP 152/52 | HR 66 | Ht 61.0 in | Wt 172.0 lb

## 2020-05-01 DIAGNOSIS — E871 Hypo-osmolality and hyponatremia: Secondary | ICD-10-CM

## 2020-05-01 DIAGNOSIS — I10 Essential (primary) hypertension: Secondary | ICD-10-CM | POA: Diagnosis not present

## 2020-05-01 DIAGNOSIS — J320 Chronic maxillary sinusitis: Secondary | ICD-10-CM

## 2020-05-01 DIAGNOSIS — N289 Disorder of kidney and ureter, unspecified: Secondary | ICD-10-CM | POA: Diagnosis not present

## 2020-05-01 DIAGNOSIS — R944 Abnormal results of kidney function studies: Secondary | ICD-10-CM | POA: Diagnosis not present

## 2020-05-01 DIAGNOSIS — N179 Acute kidney failure, unspecified: Secondary | ICD-10-CM | POA: Diagnosis not present

## 2020-05-01 DIAGNOSIS — I951 Orthostatic hypotension: Secondary | ICD-10-CM | POA: Diagnosis not present

## 2020-05-01 LAB — POCT URINALYSIS DIP (CLINITEK)
Bilirubin, UA: NEGATIVE
Blood, UA: NEGATIVE
Glucose, UA: NEGATIVE mg/dL
Leukocytes, UA: NEGATIVE
Nitrite, UA: NEGATIVE
POC PROTEIN,UA: 30 — AB
Spec Grav, UA: 1.025 (ref 1.010–1.025)
Urobilinogen, UA: 0.2 E.U./dL
pH, UA: 5.5 (ref 5.0–8.0)

## 2020-05-01 MED ORDER — DOXYCYCLINE HYCLATE 100 MG PO TABS: 100.0000 mg | ORAL_TABLET | Freq: Two times a day (BID) | ORAL | 0 refills | Status: DC

## 2020-05-01 NOTE — Assessment & Plan Note (Signed)
Blood pressure significantly elevated and not well controlled today.  In May it actually looks fantastic at 119/35.  She is currently on losartan 100 mg.  With her current renal function I told her to go ahead and cut the tab in half tomorrow morning since she already taken it today until I get her labs back.  Continue with metoprolol succinate 100 mg daily.  Can make adjustments at that point.

## 2020-05-01 NOTE — Assessment & Plan Note (Signed)
No more episodes of orthostatic hypotension since she has been off the diuretic.

## 2020-05-01 NOTE — Progress Notes (Signed)
Established Patient Office Visit  Subjective:  Patient ID: Alison [name], female    DOB: [birth year]  Age: 69 y.o. MRN: [id number]  CC:  Chief Complaint  Patient presents with  . Annual Exam    HPI ABBE BULA presents for follow-up from previous visit on July 12 with one of my partners.  She was actually recently seen in the ED after a fall.  CT was negative for any significant injury of the head but she was noted to have a sinusitis.  She had labs drawn by Lesly Rubenstein which showed that she was in acute renal failure having had a normal renal function a couple of months ago.  She was supposed to go for repeat labs at the end of last week but did not go before following up today.  A stat referral was placed to nephrology.  Her creatinine was elevated to 2.8.  Renal function 3 months ago was 0.9.  She had complained of feeling weak tired and confused when she was last here.  Globin was normal with an elevated MCV.  He did tell me that she did not complete the antibiotics for the sinusitis she said they made her really dizzy and nauseated and so she just stopped them.  He says when she thinks back she noticed she started not feeling well back in April when she was hospitalized and they switched her from losartan HCTZ 100/25 to just the plain losartan without the diuretic.  He says since then she just has not felt well at all.      Chemistry      Component Value Date/Time   NA 132 (L) 04/17/2020 1455   K 4.5 04/17/2020 1455   CL 96 (L) 04/17/2020 1455   CO2 25 04/17/2020 1455   BUN 32 (H) 04/17/2020 1455   CREATININE 2.83 (H) 04/17/2020 1455      Component Value Date/Time   CALCIUM 9.7 04/17/2020 1455   ALKPHOS 76 01/31/2016 0905   AST 16 04/17/2020 1455   ALT 11 04/17/2020 1455   BILITOT 0.9 04/17/2020 1455       Past Medical History:  Diagnosis Date  . Endometriosis     Past Surgical History:  Procedure Laterality Date  . ABDOMINAL HYSTERECTOMY      Family History   Problem Relation Age of Onset  . Cancer Mother   . Hyperlipidemia Mother   . Hypertension Mother   . Alcohol abuse Father   . Cancer Other   . Heart attack Other   . Diabetes Other   . Stroke Other     Social History   Socioeconomic History  . Marital status: Single    Spouse name: Not on file  . Number of children: 1  . Years of education: 54  . Highest education level: 12th grade  Occupational History  . Occupation: Lawyer  Tobacco Use  . Smoking status: Never Smoker  . Smokeless tobacco: Never Used  Vaping Use  . Vaping Use: Never used  Substance and Sexual Activity  . Alcohol use: Not Currently  . Drug use: No  . Sexual activity: Yes    Comment: works FT, doesn't regularly exercise  Other Topics Concern  . Not on file  Social History Narrative   Own candle in [place]   Social Determinants of Health   Financial Resource Strain:   . Difficulty of Paying Living Expenses:   Food Insecurity:   . Worried About Programme researcher, broadcasting/film/video in the  Last Year:   . Ran Out of Food in the Last Year:   Transportation Needs:   . Freight forwarder (Medical):   Marland Kitchen Lack of Transportation (Non-Medical):   Physical Activity:   . Days of Exercise per Week:   . Minutes of Exercise per Session:   Stress:   . Feeling of Stress :   Social Connections:   . Frequency of Communication with Friends and Family:   . Frequency of Social Gatherings with Friends and Family:   . Attends Religious Services:   . Active Member of Clubs or Organizations:   . Attends Banker Meetings:   Marland Kitchen Marital Status:   Intimate Partner Violence:   . Fear of Current or Ex-Partner:   . Emotionally Abused:   Marland Kitchen Physically Abused:   . Sexually Abused:     Outpatient Medications Prior to Visit  Medication Sig Dispense Refill  . esomeprazole (NEXIUM) 20 MG capsule Take by mouth.    . losartan (COZAAR) 100 MG tablet Take 1 tablet by mouth once daily 30 tablet 0  . metoprolol  succinate (TOPROL-XL) 100 MG 24 hr tablet Take 1 tablet (100 mg total) by mouth daily. Take with or immediately following a meal. 90 tablet 1  . amoxicillin-clavulanate (AUGMENTIN) 500-125 MG tablet Take 1 tablet (500 mg total) by mouth in the morning and at bedtime. 20 tablet 0   No facility-administered medications prior to visit.    No Known Allergies  ROS Review of Systems    Objective:    Physical Exam  BP (!) 152/52   Pulse 66   Ht 5\' 1"  (1.549 m)   Wt 172 lb (78 kg)   SpO2 100%   BMI 32.50 kg/m  Wt Readings from Last 3 Encounters:  05/01/20 172 lb (78 kg)  04/17/20 167 lb (75.8 kg)  02/07/20 175 lb (79.4 kg)     There are no preventive care reminders to display for this patient.  There are no preventive care reminders to display for this patient.  Lab Results  Component Value Date   TSH 0.94 01/19/2020   Lab Results  Component Value Date   WBC 7.1 04/17/2020   HGB 13.6 04/17/2020   HCT 38.6 04/17/2020   MCV 107.5 (H) 04/17/2020   PLT 315 04/17/2020   Lab Results  Component Value Date   NA 132 (L) 04/17/2020   K 4.5 04/17/2020   CO2 25 04/17/2020   GLUCOSE 128 04/17/2020   BUN 32 (H) 04/17/2020   CREATININE 2.83 (H) 04/17/2020   BILITOT 0.9 04/17/2020   ALKPHOS 76 01/31/2016   AST 16 04/17/2020   ALT 11 04/17/2020   PROT 6.4 04/17/2020   ALBUMIN 3.6 01/31/2016   CALCIUM 9.7 04/17/2020   Lab Results  Component Value Date   CHOL 208 (H) 01/19/2020   Lab Results  Component Value Date   HDL 100 01/19/2020   Lab Results  Component Value Date   LDLCALC 92 01/19/2020   Lab Results  Component Value Date   TRIG 75 01/19/2020   Lab Results  Component Value Date   CHOLHDL 2.1 01/19/2020   Lab Results  Component Value Date   HGBA1C 5.4 01/19/2020      Assessment & Plan:   Problem List Items Addressed This Visit      Cardiovascular and Mediastinum   Orthostatic hypotension    No more episodes of orthostatic hypotension since she  has been off the diuretic.  HYPERTENSION, BENIGN    Blood pressure significantly elevated and not well controlled today.  In May it actually looks fantastic at 119/35.  She is currently on losartan 100 mg.  With her current renal function I told her to go ahead and cut the tab in half tomorrow morning since she already taken it today until I get her labs back.  Continue with metoprolol succinate 100 mg daily.  Can make adjustments at that point.        Respiratory   Left maxillary sinusitis    She did not tolerate the Augmentin well.  We will treat with doxycycline since it requires no renal dose adjustment.      Relevant Medications   doxycycline (VIBRA-TABS) 100 MG tablet    Other Visit Diagnoses    Acute renal insufficiency    -  Primary   Relevant Orders   POCT URINALYSIS DIP (CLINITEK) (Completed)   Hyponatremia       Relevant Orders   POCT URINALYSIS DIP (CLINITEK) (Completed)     Acute renal insufficiency-she never went back for her repeat labs next week so we will do that today.  She actually has her ultrasound scheduled on Wednesday she has not heard back from the nephrology referral so we will check on that as well.  Says she has been avoiding ibuprofen Aleve etc.  Renal referral was placed on July 13.  She has not heard from them so we will call their office to see if they have tried to contact her.  Hyponatremia-due to recheck sodium level.   Meds ordered this encounter  Medications  . doxycycline (VIBRA-TABS) 100 MG tablet    Sig: Take 1 tablet (100 mg total) by mouth 2 (two) times daily.    Dispense:  14 tablet    Refill:  0    Follow-up: Return in about 4 weeks (around 05/29/2020) for kidneys.    Nani Gasser, MD

## 2020-05-01 NOTE — Assessment & Plan Note (Signed)
She did not tolerate the Augmentin well.  We will treat with doxycycline since it requires no renal dose adjustment.

## 2020-05-02 NOTE — Progress Notes (Signed)
Alison [name],   Kidney function MUCH better.  Electrolytes in check.  Folate low. Are you taking folate?  Have you stopped drinking alcohol? I would strongly recommend this.

## 2020-05-02 NOTE — Progress Notes (Signed)
Pt has seen results on MyChart.

## 2020-05-03 ENCOUNTER — Other Ambulatory Visit: Payer: Self-pay

## 2020-05-03 ENCOUNTER — Ambulatory Visit (HOSPITAL_BASED_OUTPATIENT_CLINIC_OR_DEPARTMENT_OTHER)
Admission: RE | Admit: 2020-05-03 | Discharge: 2020-05-03 | Disposition: A | Discharge status: Home / Self Care | Payer: Medicare Other | Source: Ambulatory Visit | Attending: Physician Assistant | Admitting: Physician Assistant

## 2020-05-03 DIAGNOSIS — R944 Abnormal results of kidney function studies: Secondary | ICD-10-CM | POA: Insufficient documentation

## 2020-05-03 DIAGNOSIS — N179 Acute kidney failure, unspecified: Secondary | ICD-10-CM | POA: Insufficient documentation

## 2020-05-05 ENCOUNTER — Other Ambulatory Visit: Payer: Self-pay | Admitting: Family Medicine

## 2020-05-05 LAB — BASIC METABOLIC PANEL WITH GFR
BUN/Creatinine Ratio: 14 (calc) (ref 6–22)
BUN: 15 mg/dL (ref 7–25)
CO2: 25 mmol/L (ref 20–32)
Calcium: 8.8 mg/dL (ref 8.6–10.4)
Chloride: 104 mmol/L (ref 98–110)
Creat: 1.11 mg/dL — ABNORMAL HIGH (ref 0.50–0.99)
GFR, Est African American: 59 mL/min/{1.73_m2} — ABNORMAL LOW (ref >=60)
GFR, Est Non African American: 51 mL/min/{1.73_m2} — ABNORMAL LOW (ref >=60)
Glucose, Bld: 103 mg/dL — ABNORMAL HIGH (ref 65–99)
Potassium: 5.3 mmol/L (ref 3.5–5.3)
Sodium: 135 mmol/L (ref 135–146)

## 2020-05-05 LAB — FOLATE: Folate: 2.2 ng/mL — ABNORMAL LOW

## 2020-05-05 LAB — VITAMIN B1: Vitamin B1 (Thiamine): <6 nmol/L — ABNORMAL LOW (ref 8–30)

## 2020-05-08 ENCOUNTER — Encounter: Payer: Self-pay | Admitting: Physician Assistant

## 2020-05-08 DIAGNOSIS — E538 Deficiency of other specified B group vitamins: Secondary | ICD-10-CM | POA: Insufficient documentation

## 2020-05-08 DIAGNOSIS — E519 Thiamine deficiency, unspecified: Secondary | ICD-10-CM | POA: Insufficient documentation

## 2020-05-08 NOTE — Progress Notes (Signed)
Normal renal ultrasound

## 2020-05-08 NOTE — Progress Notes (Signed)
Folate and b1 LOW. Likely due to alcohol use. Need to start replacement with b1 100mg  daily and folic acid 1mg  daily. Follow up with metheney for recheck.

## 2020-05-29 ENCOUNTER — Ambulatory Visit: Payer: Medicare Other | Admitting: Family Medicine

## 2020-06-13 ENCOUNTER — Other Ambulatory Visit: Payer: Self-pay | Admitting: Family Medicine

## 2020-06-30 ENCOUNTER — Encounter: Payer: Self-pay | Admitting: Family Medicine

## 2020-06-30 ENCOUNTER — Ambulatory Visit (INDEPENDENT_AMBULATORY_CARE_PROVIDER_SITE_OTHER): Payer: Medicare Other | Admitting: Family Medicine

## 2020-06-30 VITALS — BP 109/50 | HR 64 | Ht 61.0 in | Wt 165.0 lb

## 2020-06-30 DIAGNOSIS — K529 Noninfective gastroenteritis and colitis, unspecified: Secondary | ICD-10-CM

## 2020-06-30 DIAGNOSIS — R112 Nausea with vomiting, unspecified: Secondary | ICD-10-CM

## 2020-06-30 DIAGNOSIS — F1010 Alcohol abuse, uncomplicated: Secondary | ICD-10-CM

## 2020-06-30 DIAGNOSIS — K2100 Gastro-esophageal reflux disease with esophagitis, without bleeding: Secondary | ICD-10-CM

## 2020-06-30 DIAGNOSIS — F330 Major depressive disorder, recurrent, mild: Secondary | ICD-10-CM

## 2020-06-30 DIAGNOSIS — R197 Diarrhea, unspecified: Secondary | ICD-10-CM

## 2020-06-30 NOTE — Patient Instructions (Signed)
Increase your Nexium to 2 tabs in the morning about 20 to 30 minutes before you eat.  And 2 tabs at bedtime.  If this is helping continue for about 10 to 14 days and then go back down to your 2 tabs daily.  This is not helping after the weekend then please give me a call back.

## 2020-06-30 NOTE — Progress Notes (Signed)
She stated that she is unable to eat/drink anything because she gets choked and vomits. She said that she vomits thick foam.   She said that she hadn't been taking the Nexium consecutively but she did restart it 3 days ago and feels better since she restarted.

## 2020-06-30 NOTE — Progress Notes (Signed)
Established Patient Office Visit  Subjective:  Patient ID: Alison [name], female    DOB: 07/29/1951  Age: 69 y.o. MRN: [id number]  CC:  Chief Complaint  Patient presents with  . Gastroesophageal Reflux    HPI Alison [name] presents for gagging and choking after eating.  She feels like it is probably related to her reflux.  She says she stopped drinking alcohol about 2 weeks ago not long after she and her boyfriend broke up.  And says since then she feels like every time she tries to eat something she wants it it wants to come back up.  She had not been taking her Nexium regularly but did restart it about 3 days ago she takes OTC version as her insurance would not cover the prescription version.  She denies any significant nausea.  Plans on going to an addiction group through church starting next week.  She also reports that she has had diarrhea daily about 4-5 bowel movements per day for the last 3 to 4 months.  It is mostly watery.  She denies any blood in the stool.  No abdominal pain or cramps.  Past Medical History:  Diagnosis Date  . Endometriosis     Past Surgical History:  Procedure Laterality Date  . ABDOMINAL HYSTERECTOMY      Family History  Problem Relation Age of Onset  . Cancer Mother   . Hyperlipidemia Mother   . Hypertension Mother   . Alcohol abuse Father   . Cancer Other   . Heart attack Other   . Diabetes Other   . Stroke Other     Social History   Socioeconomic History  . Marital status: Single    Spouse name: Not on file  . Number of children: 1  . Years of education: 60  . Highest education level: 12th grade  Occupational History  . Occupation: Lawyer  Tobacco Use  . Smoking status: Never Smoker  . Smokeless tobacco: Never Used  Vaping Use  . Vaping Use: Never used  Substance and Sexual Activity  . Alcohol use: Not Currently  . Drug use: No  . Sexual activity: Yes    Comment: works FT, doesn't regularly exercise  Other  Topics Concern  . Not on file  Social History Narrative   Own candle in [place]   Social Determinants of Health   Financial Resource Strain:   . Difficulty of Paying Living Expenses: Not on file  Food Insecurity:   . Worried About Programme researcher, broadcasting/film/video in the Last Year: Not on file  . Ran Out of Food in the Last Year: Not on file  Transportation Needs:   . Lack of Transportation (Medical): Not on file  . Lack of Transportation (Non-Medical): Not on file  Physical Activity:   . Days of Exercise per Week: Not on file  . Minutes of Exercise per Session: Not on file  Stress:   . Feeling of Stress : Not on file  Social Connections:   . Frequency of Communication with Friends and Family: Not on file  . Frequency of Social Gatherings with Friends and Family: Not on file  . Attends Religious Services: Not on file  . Active Member of Clubs or Organizations: Not on file  . Attends Banker Meetings: Not on file  . Marital Status: Not on file  Intimate Partner Violence:   . Fear of Current or Ex-Partner: Not on file  . Emotionally Abused: Not on  file  . Physically Abused: Not on file  . Sexually Abused: Not on file    Outpatient Medications Prior to Visit  Medication Sig Dispense Refill  . esomeprazole (NEXIUM) 20 MG capsule Take by mouth.    . losartan (COZAAR) 100 MG tablet Take 1 tablet by mouth once daily 30 tablet 0  . metoprolol succinate (TOPROL-XL) 100 MG 24 hr tablet Take 1 tablet (100 mg total) by mouth daily. Take with or immediately following a meal. 90 tablet 1  . PREMARIN vaginal cream Place vaginally.    Marland Kitchen doxycycline (VIBRA-TABS) 100 MG tablet Take 1 tablet (100 mg total) by mouth 2 (two) times daily. 14 tablet 0   No facility-administered medications prior to visit.    No Known Allergies  ROS Review of Systems    Objective:    Physical Exam Constitutional:      Appearance: She is well-developed.  HENT:     Head: Normocephalic and  atraumatic.  Cardiovascular:     Rate and Rhythm: Normal rate and regular rhythm.     Heart sounds: Normal heart sounds.  Pulmonary:     Effort: Pulmonary effort is normal.     Breath sounds: Normal breath sounds.  Abdominal:     General: Abdomen is flat. Bowel sounds are normal.     Palpations: Abdomen is soft.  Skin:    General: Skin is warm and dry.  Neurological:     Mental Status: She is alert and oriented to person, place, and time.  Psychiatric:        Behavior: Behavior normal.     BP (!) 109/50   Pulse 64   Ht 5\' 1"  (1.549 m)   Wt 165 lb (74.8 kg)   SpO2 100%   BMI 31.18 kg/m  Wt Readings from Last 3 Encounters:  06/30/20 165 lb (74.8 kg)  05/01/20 172 lb (78 kg)  04/17/20 167 lb (75.8 kg)     Health Maintenance Due  Topic Date Due  . Hepatitis C Screening  Never done    There are no preventive care reminders to display for this patient.  Lab Results  Component Value Date   TSH 0.94 01/19/2020   Lab Results  Component Value Date   WBC 7.1 04/17/2020   HGB 13.6 04/17/2020   HCT 38.6 04/17/2020   MCV 107.5 (H) 04/17/2020   PLT 315 04/17/2020   Lab Results  Component Value Date   NA 135 05/01/2020   K 5.3 05/01/2020   CO2 25 05/01/2020   GLUCOSE 103 (H) 05/01/2020   BUN 15 05/01/2020   CREATININE 1.11 (H) 05/01/2020   BILITOT 0.9 04/17/2020   ALKPHOS 76 01/31/2016   AST 16 04/17/2020   ALT 11 04/17/2020   PROT 6.4 04/17/2020   ALBUMIN 3.6 01/31/2016   CALCIUM 8.8 05/01/2020   Lab Results  Component Value Date   CHOL 208 (H) 01/19/2020   Lab Results  Component Value Date   HDL 100 01/19/2020   Lab Results  Component Value Date   LDLCALC 92 01/19/2020   Lab Results  Component Value Date   TRIG 75 01/19/2020   Lab Results  Component Value Date   CHOLHDL 2.1 01/19/2020   Lab Results  Component Value Date   HGBA1C 5.4 01/19/2020      Assessment & Plan:   Problem List Items Addressed This Visit      Digestive   Nausea  vomiting and diarrhea    See note above.  Had had issues with this on and off for years      GERD - Primary    Increase Nexium to 40mg  BID which is the max. She has quit drinking alcohol and that will help. She should be through most of the detox periods.  Call after weeken if not improving and may need to add carafate.  She is not nauseated which is great. If continues will need GI referrral.       Chronic diarrhea    Start with workup for stool culture and C diff testing.  If negative consider GI referral  Maybe function or IBS related.  Stools are watery.       Relevant Orders   Stool culture   Clostridium difficile Toxin B, Qualitative, Real-Time PCR     Other   Depression    Increased stress with recent breakup with long term boyfriend. Consider counseling if needed.       Alcohol abuse    Has abstained for about 2 weeks.  She is planning on joining a church addition group next week. highly encouraged her to get support to help her abstain.          No orders of the defined types were placed in this encounter.   Follow-up: Return if symptoms worsen or fail to improve.    , MD

## 2020-07-01 DIAGNOSIS — K529 Noninfective gastroenteritis and colitis, unspecified: Secondary | ICD-10-CM | POA: Insufficient documentation

## 2020-07-01 NOTE — Assessment & Plan Note (Signed)
See note above.  Had had issues with this on and off for years

## 2020-07-01 NOTE — Assessment & Plan Note (Signed)
Increase Nexium to 40mg  BID which is the max. She has quit drinking alcohol and that will help. She should be through most of the detox periods.  Call after weeken if not improving and may need to add carafate.  She is not nauseated which is great. If continues will need GI referrral.

## 2020-07-01 NOTE — Assessment & Plan Note (Signed)
Start with workup for stool culture and C diff testing.  If negative consider GI referral  Maybe function or IBS related.  Stools are watery.

## 2020-07-01 NOTE — Assessment & Plan Note (Signed)
Has abstained for about 2 weeks.  She is planning on joining a church addition group next week. highly encouraged her to get support to help her abstain.

## 2020-07-01 NOTE — Assessment & Plan Note (Signed)
Increased stress with recent breakup with long term boyfriend. Consider counseling if needed.

## 2020-07-13 ENCOUNTER — Other Ambulatory Visit: Payer: Self-pay | Admitting: Family Medicine

## 2020-08-11 ENCOUNTER — Other Ambulatory Visit: Payer: Self-pay

## 2020-08-11 ENCOUNTER — Ambulatory Visit (INDEPENDENT_AMBULATORY_CARE_PROVIDER_SITE_OTHER): Payer: Medicare Other | Admitting: Physician Assistant

## 2020-08-11 ENCOUNTER — Encounter: Payer: Self-pay | Admitting: Physician Assistant

## 2020-08-11 ENCOUNTER — Other Ambulatory Visit: Payer: Self-pay | Admitting: Family Medicine

## 2020-08-11 VITALS — BP 160/70 | HR 60 | Temp 97.6°F | Ht 61.0 in | Wt 172.0 lb

## 2020-08-11 DIAGNOSIS — K130 Diseases of lips: Secondary | ICD-10-CM

## 2020-08-11 DIAGNOSIS — I10 Essential (primary) hypertension: Secondary | ICD-10-CM

## 2020-08-11 DIAGNOSIS — E519 Thiamine deficiency, unspecified: Secondary | ICD-10-CM | POA: Diagnosis not present

## 2020-08-11 DIAGNOSIS — E871 Hypo-osmolality and hyponatremia: Secondary | ICD-10-CM | POA: Diagnosis not present

## 2020-08-11 DIAGNOSIS — L0100 Impetigo, unspecified: Secondary | ICD-10-CM

## 2020-08-11 DIAGNOSIS — E538 Deficiency of other specified B group vitamins: Secondary | ICD-10-CM | POA: Diagnosis not present

## 2020-08-11 MED ORDER — TRIAMCINOLONE ACETONIDE 0.1 % MT PSTE: 1.0000 "application " | PASTE | Freq: Two times a day (BID) | OROMUCOSAL | 1 refills | Status: DC

## 2020-08-11 MED ORDER — MUPIROCIN 2 % EX OINT: TOPICAL_OINTMENT | CUTANEOUS | 0 refills | Status: DC

## 2020-08-11 NOTE — Patient Instructions (Signed)
Impetigo, Adult Impetigo is an infection of the skin. It commonly occurs in young children, but it can also occur in adults. The infection causes itchy blisters and sores that produce brownish-yellow fluid. As the fluid dries, it forms a thick, honey-colored crust. These skin changes usually occur on the face, but they can also affect other areas of the body. Impetigo usually goes away in 7-10 days with treatment. What are the causes? This condition is caused by two types of bacteria. It may be caused by staphylococci or streptococci bacteria. These bacteria cause impetigo when they get under the surface of the skin. This often happens after some damage to the skin, such as:  Cuts, scrapes, or scratches.  Rashes.  Insect bites, especially when you scratch the area of a bite.  Chickenpox or other illnesses that cause open skin sores.  Nail biting or chewing. Impetigo can spread easily from one person to another (is contagious). It may be spread through close skin contact or by sharing towels, clothing, or other items that an infected person has touched. What increases the risk? The following factors may make you more likely to develop this condition:  Playing sports that include skin-to-skin contact with others.  Having a skin condition with open sores, such as chickenpox.  Having diabetes.  Having a weak body defense system (immune system).  Having many skin cuts or scrapes.  Living in an area that has high humidity levels.  Having poor hygiene.  Having high levels of staphylococci in your nose. What are the signs or symptoms? The main symptom of this condition is small blisters, often on the face around the mouth and nose. In time, the blisters break open and turn into tiny sores (lesions) with a yellow crust. In some cases, the blisters cause itching or burning. With scratching, irritation, or lack of treatment, these small lesions may get larger. Other possible symptoms  include:  Larger blisters.  Pus.  Swollen lymph glands. Scratching the affected area can cause impetigo to spread to other parts of the body. The bacteria can get under the fingernails and spread when you touch another area of your skin. How is this diagnosed? This condition is usually diagnosed during a physical exam. A skin sample or a sample of fluid from a blister may be taken for lab tests that involve growing bacteria (culture test). Lab tests can help to confirm the diagnosis or help to determine the best treatment. How is this treated? Treatment for this condition depends on the severity of the condition:  Mild impetigo can be treated with prescription antibiotic cream.  Oral antibiotic medicine may be used in more severe cases.  Medicines that reduce itchiness (antihistamines)may also be used. Follow these instructions at home: Medicines  Take over-the-counter and prescription medicines only as told by your health care provider.  Apply or take your antibiotic as told by your health care provider. Do not stop using the antibiotic even if your condition improves. General instructions   To help prevent impetigo from spreading to other body areas: ? Keep your fingernails short and clean. ? Do not scratch the blisters or sores. ? Cover infected areas, if necessary, to keep from scratching. ? Wash your hands often with soap and warm water.  Before applying antibiotic cream or ointment, you should: ? Gently wash the infected areas with antibacterial soap and warm water. ? Soak crusted areas in warm, soapy water using antibacterial soap. ? Gently rub the areas to remove crusts. Do not   scrub.  Do not share towels.  Wash your clothing and bedsheets in warm water that is 140F (60C) or warmer.  Stay home until you have used an antibiotic cream for 48 hours (2 days) or an oral antibiotic medicine for 24 hours (1 day). You should only return to work and activities with other  people if your skin shows significant improvement. ? You may return to contact sports after you have used antibiotic medicine for 72 hours (3 days).  Keep all follow-up visits as told by your health care provider. This is important. How is this prevented?  Wash your hands often with soap and warm water.  Do not share towels, washcloths, clothing, bedding, or razors.  Keep your fingernails short.  Keep any cuts, scrapes, bug bites, or rashes clean and covered.  Use insect repellent to prevent bug bites. Contact a health care provider if:  You develop more blisters or sores even with treatment.  Other family members get sores.  Your skin sores are not improving after 72 hours (3 days) of treatment.  You have a fever. Get help right away if:  You see spreading redness or swelling of the skin around your sores.  You see red streaks coming from your sores.  You develop a sore throat.  The area around your rash becomes warm, red, or tender to the touch.  You have dark, reddish-brown urine.  You do not urinate often or you urinate small amounts.  You are very tired (lethargic).  You have swelling in the face, hands, or feet. Summary  Impetigo is a skin infection that causes itchy blisters and sores that produce brownish-yellow fluid. As the fluid dries, it forms a crust.  This condition is caused by staphylococci or streptococci bacteria. These bacteria cause impetigo when they get under the surface of the skin, such as through cuts, rashes, bug bites, or open sores.  Treatment for this condition may include antibiotic ointment or oral antibiotics.  To help prevent impetigo from spreading to other body areas, make sure you keep your fingernails short, avoid scratching, cover any blisters, and wash your hands often.  If you have impetigo, stay home until you have used an antibiotic cream for 48 hours (2 days) or an oral antibiotic medicine for 24 hours (1 day). You should  only return to work and activities with other people if your skin shows significant improvement. This information is not intended to replace advice given to you by your health care provider. Make sure you discuss any questions you have with your health care provider. Document Revised: 11/03/2018 Document Reviewed: 10/15/2016 Elsevier Patient Education  2020 [employer]  

## 2020-08-11 NOTE — Progress Notes (Signed)
Subjective:    Patient ID: Alison [name], female    DOB: December 03, 1950, 69 y.o.   MRN: [id number]  HPI  Pt is a 69 yo female with HTN who presents to the clinic with dry, chapped, painful, burning lips for the last 2 months. She has been using chapstick and lidocaine lip ointment with no relief. She did get sunburned 2 months ago when this happened. No vesicles/ulcers. oringally felt like some sores in mouth but never saw anything. No fever, chills, nausea.    Hx of hyponatremia. She has been eating more salt. Not checking BP at home. Not taking B1 or folate. No CP, palpitations, headaches, or vision changes. Taking BP medications cozaar and metoprolol daily.   .. 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 09/09/2016   IFG (impaired fasting glucose) 09/09/2016   Duodenal bulb ulcer with perforation (HCC) 02/20/2017   Elevated LFTs 04/22/2017   Obesity (BMI 30.0-34.9) 07/29/2016   Orthostasis 07/29/2016   Dislocation of finger PIP joint 09/14/2018   Lumbar spinal stenosis 03/16/2019   Dyspareunia, female 07/05/2019   Atrophic vaginitis 07/05/2019   Post concussion syndrome 04/17/2020   Orthostatic hypotension 04/17/2020   Vitamin B1 deficiency 05/08/2020   Folate deficiency 05/08/2020   Urinary tract infection with hematuria 02/02/2020   Rash 02/03/2020   Nausea vomiting and diarrhea 08/30/2019   Malaise and fatigue 02/02/2020   Hyponatremia 08/30/2019   Hypokalemia 08/30/2019   Combined forms of 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      Objective:   Physical Exam Vitals reviewed.  Constitutional:      Appearance: Normal appearance. She is obese.  HENT:     Head: Normocephalic.     Mouth/Throat:     Comments: Dry chapped swollen lips. No vesicles appearance of some honey crusting on upper lip.  Cardiovascular:     Rate and Rhythm: Normal rate.     Pulses: Normal pulses.  Pulmonary:     Effort: Pulmonary effort is normal.  Neurological:     Mental Status: She is alert.  Psychiatric:        Mood and Affect: Mood normal.           Assessment & Plan:  Marland KitchenMarland KitchenDiagnoses and all orders for this visit:  Cracked lips -     triamcinolone (KENALOG) 0.1 % paste; Use as directed 1 application in the mouth or throat 2 (two) times daily.  Impetigo -     mupirocin ointment (BACTROBAN) 2 %; Apply to affected area TID for 7 days.  Hyponatremia -     COMPLETE METABOLIC PANEL WITH GFR  Vitamin B1 deficiency -     Vitamin B1  Low folate -     Folate  HYPERTENSION, BENIGN   Possible sun dermatitis with some chronic chapped lips and impetigo  setting in. Sent bactroband and triamcinolone topical to pharmacy. Let me know next week how you are doing.   Recheck labs today. Consider starting medication.   Discussed elevated BP. Get cuff at home. Start checking. Bring in log in 2 weeks. If this high needs to start more medication. Discussed decreasing salt intake.

## 2020-08-24 ENCOUNTER — Other Ambulatory Visit: Payer: Self-pay

## 2020-08-24 ENCOUNTER — Encounter: Payer: Self-pay | Admitting: Family Medicine

## 2020-08-24 ENCOUNTER — Ambulatory Visit (INDEPENDENT_AMBULATORY_CARE_PROVIDER_SITE_OTHER): Payer: Medicare Other | Admitting: Family Medicine

## 2020-08-24 VITALS — BP 139/52 | HR 59 | Ht 61.0 in | Wt 169.0 lb

## 2020-08-24 DIAGNOSIS — E876 Hypokalemia: Secondary | ICD-10-CM

## 2020-08-24 DIAGNOSIS — I10 Essential (primary) hypertension: Secondary | ICD-10-CM | POA: Diagnosis not present

## 2020-08-24 DIAGNOSIS — K130 Diseases of lips: Secondary | ICD-10-CM | POA: Diagnosis not present

## 2020-08-24 DIAGNOSIS — F330 Major depressive disorder, recurrent, mild: Secondary | ICD-10-CM | POA: Diagnosis not present

## 2020-08-24 MED ORDER — FLUOXETINE HCL 10 MG PO CAPS: 10.0000 mg | ORAL_CAPSULE | Freq: Every day | ORAL | 1 refills | Status: DC

## 2020-08-24 NOTE — Assessment & Plan Note (Signed)
Active recurrent depression discussed treatment options including regular therapist or counselor which she is not currently interested in because of the cost of co-pays.  We also discussed starting a medication such as an SSRI she was open to that after much discussion.  I am concerned about its effectiveness if she is still drinking alcohol but will start with fluoxetine.  Follow-up in 3 to 4 weeks.  Also still encouraged her to think about getting in for therapy also encouraged her to get more active doing chair exercises and stretching daily.
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
## 2020-08-24 NOTE — Assessment & Plan Note (Signed)
Due to recheck labs did encourage her to go and reprinted the lab slip for her today.  She also has a history of hyponatremia and B1 and folate deficiency and she is not currently on any supplementation.

## 2020-08-24 NOTE — Assessment & Plan Note (Signed)
Blood pressure is elevated today and not optimal but we will keep an eye on it since I Minna see her back in 1 month.  Repeat blood pressure was better.

## 2020-08-25 ENCOUNTER — Ambulatory Visit: Payer: Medicare Other | Admitting: Physician Assistant

## 2020-09-01 DIAGNOSIS — R519 Headache, unspecified: Secondary | ICD-10-CM | POA: Diagnosis not present

## 2020-09-01 DIAGNOSIS — I10 Essential (primary) hypertension: Secondary | ICD-10-CM | POA: Diagnosis not present

## 2020-09-01 DIAGNOSIS — I498 Other specified cardiac arrhythmias: Secondary | ICD-10-CM | POA: Diagnosis not present

## 2020-09-01 DIAGNOSIS — J984 Other disorders of lung: Secondary | ICD-10-CM | POA: Diagnosis not present

## 2020-09-01 DIAGNOSIS — M1990 Unspecified osteoarthritis, unspecified site: Secondary | ICD-10-CM | POA: Diagnosis not present

## 2020-09-01 DIAGNOSIS — Z79899 Other long term (current) drug therapy: Secondary | ICD-10-CM | POA: Diagnosis not present

## 2020-09-01 DIAGNOSIS — R42 Dizziness and giddiness: Secondary | ICD-10-CM | POA: Insufficient documentation

## 2020-09-01 DIAGNOSIS — Z8719 Personal history of other diseases of the digestive system: Secondary | ICD-10-CM | POA: Diagnosis not present

## 2020-09-01 DIAGNOSIS — Z9049 Acquired absence of other specified parts of digestive tract: Secondary | ICD-10-CM | POA: Diagnosis not present

## 2020-09-01 DIAGNOSIS — M48061 Spinal stenosis, lumbar region without neurogenic claudication: Secondary | ICD-10-CM | POA: Diagnosis not present

## 2020-09-01 DIAGNOSIS — E871 Hypo-osmolality and hyponatremia: Secondary | ICD-10-CM | POA: Diagnosis not present

## 2020-09-01 DIAGNOSIS — Z880 Allergy status to penicillin: Secondary | ICD-10-CM | POA: Diagnosis not present

## 2020-09-01 DIAGNOSIS — E861 Hypovolemia: Secondary | ICD-10-CM | POA: Diagnosis not present

## 2020-09-01 DIAGNOSIS — J90 Pleural effusion, not elsewhere classified: Secondary | ICD-10-CM | POA: Diagnosis not present

## 2020-09-02 DIAGNOSIS — E871 Hypo-osmolality and hyponatremia: Secondary | ICD-10-CM | POA: Diagnosis not present

## 2020-09-02 DIAGNOSIS — R42 Dizziness and giddiness: Secondary | ICD-10-CM | POA: Diagnosis not present

## 2020-09-02 DIAGNOSIS — I10 Essential (primary) hypertension: Secondary | ICD-10-CM | POA: Diagnosis not present

## 2020-09-03 DIAGNOSIS — E871 Hypo-osmolality and hyponatremia: Secondary | ICD-10-CM | POA: Diagnosis not present

## 2020-09-03 DIAGNOSIS — I10 Essential (primary) hypertension: Secondary | ICD-10-CM | POA: Diagnosis not present

## 2020-09-03 DIAGNOSIS — R42 Dizziness and giddiness: Secondary | ICD-10-CM | POA: Diagnosis not present

## 2020-09-05 ENCOUNTER — Telehealth: Payer: Self-pay

## 2020-09-05 NOTE — Telephone Encounter (Signed)
Eurydice called and left a message for Archie Patten to call her back.

## 2020-09-06 MED ORDER — FLUCONAZOLE 150 MG PO TABS: 150.0000 mg | ORAL_TABLET | Freq: Once | ORAL | 0 refills | Status: DC

## 2020-09-06 NOTE — Telephone Encounter (Signed)
Spoke w/pt and sent fluconazole to her pharmacy.

## 2020-09-06 NOTE — Telephone Encounter (Signed)
Agree with documentation as above.   Taletha Twiford, MD  

## 2020-09-06 NOTE — Telephone Encounter (Signed)
Serrita called and states she has vaginal itching. She states every time she goes to the hospital she gets a yeast infection. Denies fever, chills, sweats, vaginal discharge or pelvic pain. She refused a visit without the say of Dr Linford Arnold. Please advise.

## 2020-09-11 ENCOUNTER — Encounter: Payer: Self-pay | Admitting: Family Medicine

## 2020-09-11 ENCOUNTER — Ambulatory Visit (INDEPENDENT_AMBULATORY_CARE_PROVIDER_SITE_OTHER): Payer: Medicare Other | Admitting: Family Medicine

## 2020-09-11 VITALS — BP 155/55 | HR 66 | Ht 61.0 in | Wt 168.0 lb

## 2020-09-11 DIAGNOSIS — E871 Hypo-osmolality and hyponatremia: Secondary | ICD-10-CM

## 2020-09-11 DIAGNOSIS — I10 Essential (primary) hypertension: Secondary | ICD-10-CM | POA: Diagnosis not present

## 2020-09-11 NOTE — Progress Notes (Signed)
Established Patient Office Visit  Subjective:  Patient ID: Alison [name], female    DOB: [birth year]  Age: 69 y.o. MRN: [id number]  CC:  Chief Complaint  Patient presents with  . Hospitalization Follow-up    HPI Alison [name] presents for hospital F/U for hospital follow-up from [facility]. She presented initially on 1126 with dizziness and headaches that have been going on for a couple of days she just felt like she was feeling disoriented and weak.  She was admitted on November 26  and stayed for 2 days. She was discharged home on 11/28.  She has had this happen previously but before she was actually on hydrochlorothiazide.  This time she has been off of hydrochlorothiazide for at least 6 months.  Does have a history of alcohol abuse but says that the week before she went she felt so bad she really was not drinking alcohol.  But she also was not eating or drinking in general and so was dehydrated by the time she got there.  Past Medical History:  Diagnosis Date  . Endometriosis     Past Surgical History:  Procedure Laterality Date  . ABDOMINAL HYSTERECTOMY      Family History  Problem Relation Age of Onset  . Cancer Mother   . Hyperlipidemia Mother   . Hypertension Mother   . Alcohol abuse Father   . Cancer Other   . Heart attack Other   . Diabetes Other   . Stroke Other     Social History   Socioeconomic History  . Marital status: Single    Spouse name: Not on file  . Number of children: 1  . Years of education: 67  . Highest education level: 12th grade  Occupational History  . Occupation: Lawyer  Tobacco Use  . Smoking status: Never Smoker  . Smokeless tobacco: Never Used  Vaping Use  . Vaping Use: Never used  Substance and Sexual Activity  . Alcohol use: Not Currently  . Drug use: No  . Sexual activity: Yes    Comment: works FT, doesn't regularly exercise  Other Topics Concern  . Not on file  Social History Narrative   Own candle in  [employer]   Social Determinants of Health   Financial Resource Strain:   . Difficulty of Paying Living Expenses: Not on file  Food Insecurity:   . Worried About Programme researcher, broadcasting/film/video in the Last Year: Not on file  . Ran Out of Food in the Last Year: Not on file  Transportation Needs:   . Lack of Transportation (Medical): Not on file  . Lack of Transportation (Non-Medical): Not on file  Physical Activity:   . Days of Exercise per Week: Not on file  . Minutes of Exercise per Session: Not on file  Stress:   . Feeling of Stress : Not on file  Social Connections:   . Frequency of Communication with Friends and Family: Not on file  . Frequency of Social Gatherings with Friends and Family: Not on file  . Attends Religious Services: Not on file  . Active Member of Clubs or Organizations: Not on file  . Attends Banker Meetings: Not on file  . Marital Status: Not on file  Intimate Partner Violence:   . Fear of Current or Ex-Partner: Not on file  . Emotionally Abused: Not on file  . Physically Abused: Not on file  . Sexually Abused: Not on file    Outpatient Medications Prior to  Visit  Medication Sig Dispense Refill  . esomeprazole (NEXIUM) 20 MG capsule Take by mouth.    . losartan (COZAAR) 100 MG tablet Take 1 tablet by mouth once daily 30 tablet 5  . metoprolol succinate (TOPROL-XL) 100 MG 24 hr tablet TAKE 1 TABLET BY MOUTH ONCE DAILY TAKE  WITH  OR  IMMEDIATELY  FOLLOWING  A  MEAL 90 tablet 0  . PREMARIN vaginal cream Place vaginally.    . triamcinolone (KENALOG) 0.1 % paste Use as directed 1 application in the mouth or throat 2 (two) times daily. 5 g 1  . FLUoxetine (PROZAC) 10 MG capsule Take 1 capsule (10 mg total) by mouth daily. Increase to 2 caps daily after  8 days 60 capsule 1   No facility-administered medications prior to visit.    No Known Allergies  ROS Review of Systems    Objective:    Physical Exam Constitutional:      Appearance: She is  well-developed.  HENT:     Head: Normocephalic and atraumatic.  Cardiovascular:     Rate and Rhythm: Normal rate and regular rhythm.     Heart sounds: Normal heart sounds.  Pulmonary:     Effort: Pulmonary effort is normal.     Breath sounds: Normal breath sounds.  Skin:    General: Skin is warm and dry.  Neurological:     Mental Status: She is alert and oriented to person, place, and time.  Psychiatric:        Behavior: Behavior normal.     BP (!) 155/55   Pulse 66   Ht 5\' 1"  (1.549 m)   Wt 168 lb (76.2 kg)   SpO2 99%   BMI 31.74 kg/m  Wt Readings from Last 3 Encounters:  09/11/20 168 lb (76.2 kg)  08/24/20 169 lb (76.7 kg)  08/11/20 172 lb (78 kg)     Health Maintenance Due  Topic Date Due  . Hepatitis C Screening  Never done  . PNA vac Low Risk Adult (1 of 2 - PCV13) Never done    There are no preventive care reminders to display for this patient.  Lab Results  Component Value Date   TSH 0.94 01/19/2020   Lab Results  Component Value Date   WBC 7.1 04/17/2020   HGB 13.6 04/17/2020   HCT 38.6 04/17/2020   MCV 107.5 (H) 04/17/2020   PLT 315 04/17/2020   Lab Results  Component Value Date   NA 135 05/01/2020   K 5.3 05/01/2020   CO2 25 05/01/2020   GLUCOSE 103 (H) 05/01/2020   BUN 15 05/01/2020   CREATININE 1.11 (H) 05/01/2020   BILITOT 0.9 04/17/2020   ALKPHOS 76 01/31/2016   AST 16 04/17/2020   ALT 11 04/17/2020   PROT 6.4 04/17/2020   ALBUMIN 3.6 01/31/2016   CALCIUM 8.8 05/01/2020   Lab Results  Component Value Date   CHOL 208 (H) 01/19/2020   Lab Results  Component Value Date   HDL 100 01/19/2020   Lab Results  Component Value Date   LDLCALC 92 01/19/2020   Lab Results  Component Value Date   TRIG 75 01/19/2020   Lab Results  Component Value Date   CHOLHDL 2.1 01/19/2020   Lab Results  Component Value Date   HGBA1C 5.4 01/19/2020      Assessment & Plan:   Problem List Items Addressed This Visit      Cardiovascular  and Mediastinum   HYPERTENSION, BENIGN - Primary  BP not well controlled today.  Has been drinking alcohol intermittently she says a bottle of wine once or twice a week.  Gust how alcohol can increase her blood pressure.      Relevant Orders   Sodium, urine, random   Osmolality, urine   BASIC METABOLIC PANEL WITH GFR     Other   Hyponatremia    2 additional labs today I do want a repeat her sodium level just to make sure that she is maintaining.  Also to check her serum osmolality and urine sodium levels for further work-up.  The initial thought was that the diuretic could been the culprit but now it has happened again off of a diuretic which she has been off of for at least 6 months.      Relevant Orders   Sodium, urine, random   Osmolality, urine   BASIC METABOLIC PANEL WITH GFR      No orders of the defined types were placed in this encounter.   Follow-up: Return in about 2 weeks (around 09/25/2020) for nurse visit for bp check.   I spent 35 minutes on the day of the encounter to include pre-visit record review, face-to-face time with the patient and post visit ordering of test.   Nani Gasser, MD

## 2020-09-11 NOTE — Assessment & Plan Note (Signed)
2 additional labs today I do want a repeat her sodium level just to make sure that she is maintaining.  Also to check her serum osmolality and urine sodium levels for further work-up.  The initial thought was that the diuretic could been the culprit but now it has happened again off of a diuretic which she has been off of for at least 6 months.

## 2020-09-11 NOTE — Assessment & Plan Note (Addendum)
BP not well controlled today.  Has been drinking alcohol intermittently she says a bottle of wine once or twice a week.  Gust how alcohol can increase her blood pressure. Return in 2 weeks for recheck

## 2020-09-25 ENCOUNTER — Other Ambulatory Visit: Payer: Self-pay

## 2020-09-25 ENCOUNTER — Ambulatory Visit (INDEPENDENT_AMBULATORY_CARE_PROVIDER_SITE_OTHER): Payer: Medicare Other | Admitting: Family Medicine

## 2020-09-25 VITALS — BP 162/70 | HR 65

## 2020-09-25 DIAGNOSIS — I10 Essential (primary) hypertension: Secondary | ICD-10-CM

## 2020-09-25 DIAGNOSIS — E871 Hypo-osmolality and hyponatremia: Secondary | ICD-10-CM | POA: Diagnosis not present

## 2020-09-25 NOTE — Progress Notes (Signed)
Established Patient Office Visit  Subjective:  Patient ID: Alison [name], female    DOB: 04/18/1951  Age: 69 y.o. MRN: [id number]  CC:  Chief Complaint  Patient presents with  . Hypertension    HPI Alison [name] presents for blood pressure check. She states her blood pressure is always elevated when she comes to the office. Denies chest pain, shortness of breath or dizziness.   Past Medical History:  Diagnosis Date  . Endometriosis     Past Surgical History:  Procedure Laterality Date  . ABDOMINAL HYSTERECTOMY      Family History  Problem Relation Age of Onset  . Cancer Mother   . Hyperlipidemia Mother   . Hypertension Mother   . Alcohol abuse Father   . Cancer Other   . Heart attack Other   . Diabetes Other   . Stroke Other     Social History   Socioeconomic History  . Marital status: Single    Spouse name: Not on file  . Number of children: 1  . Years of education: 26  . Highest education level: 12th grade  Occupational History  . Occupation: Lawyer  Tobacco Use  . Smoking status: Never Smoker  . Smokeless tobacco: Never Used  Vaping Use  . Vaping Use: Never used  Substance and Sexual Activity  . Alcohol use: Not Currently  . Drug use: No  . Sexual activity: Yes    Comment: works FT, doesn't regularly exercise  Other Topics Concern  . Not on file  Social History Narrative   Own candle in [place]   Social Determinants of Health   Financial Resource Strain: Not on file  Food Insecurity: Not on file  Transportation Needs: Not on file  Physical Activity: Not on file  Stress: Not on file  Social Connections: Not on file  Intimate Partner Violence: Not on file    Outpatient Medications Prior to Visit  Medication Sig Dispense Refill  . esomeprazole (NEXIUM) 20 MG capsule Take by mouth.    . losartan (COZAAR) 100 MG tablet Take 1 tablet by mouth once daily 30 tablet 5  . metoprolol succinate (TOPROL-XL) 100 MG 24 hr tablet TAKE 1  TABLET BY MOUTH ONCE DAILY TAKE  WITH  OR  IMMEDIATELY  FOLLOWING  A  MEAL 90 tablet 0  . PREMARIN vaginal cream Place vaginally.    . triamcinolone (KENALOG) 0.1 % paste Use as directed 1 application in the mouth or throat 2 (two) times daily. 5 g 1   No facility-administered medications prior to visit.    No Known Allergies  ROS Review of Systems    Objective:    Physical Exam  BP (!) 167/67   Pulse 65   SpO2 100%  Wt Readings from Last 3 Encounters:  09/11/20 168 lb (76.2 kg)  08/24/20 169 lb (76.7 kg)  08/11/20 172 lb (78 kg)     Health Maintenance Due  Topic Date Due  . Hepatitis C Screening  Never done  . PNA vac Low Risk Adult (1 of 2 - PCV13) Never done    There are no preventive care reminders to display for this patient.  Lab Results  Component Value Date   TSH 0.94 01/19/2020   Lab Results  Component Value Date   WBC 7.1 04/17/2020   HGB 13.6 04/17/2020   HCT 38.6 04/17/2020   MCV 107.5 (H) 04/17/2020   PLT 315 04/17/2020   Lab Results  Component Value Date  NA 135 05/01/2020   K 5.3 05/01/2020   CO2 25 05/01/2020   GLUCOSE 103 (H) 05/01/2020   BUN 15 05/01/2020   CREATININE 1.11 (H) 05/01/2020   BILITOT 0.9 04/17/2020   ALKPHOS 76 01/31/2016   AST 16 04/17/2020   ALT 11 04/17/2020   PROT 6.4 04/17/2020   ALBUMIN 3.6 01/31/2016   CALCIUM 8.8 05/01/2020   Lab Results  Component Value Date   CHOL 208 (H) 01/19/2020   Lab Results  Component Value Date   HDL 100 01/19/2020   Lab Results  Component Value Date   LDLCALC 92 01/19/2020   Lab Results  Component Value Date   TRIG 75 01/19/2020   Lab Results  Component Value Date   CHOLHDL 2.1 01/19/2020   Lab Results  Component Value Date   HGBA1C 5.4 01/19/2020      Assessment & Plan:  Hypertension - Blood pressure readings are elevated today. Advised to check blood pressure at home daily. Follow up for a nurse visit in 2 weeks and bring in home readings.   Note - She did  not schedule a follow up on her way out of the office.   Problem List Items Addressed This Visit    HYPERTENSION, BENIGN - Primary      No orders of the defined types were placed in this encounter.   Follow-up: No follow-ups on file.    Esmond Harps, CMA

## 2020-09-25 NOTE — Progress Notes (Signed)
P elevated she reports that home blood pressures usually look better just encouraged her to keep track of those at home and bring them in with her if possible. Patient seemed very anxious today and not quite like herself.  Nani Gasser, MD

## 2020-09-27 LAB — BASIC METABOLIC PANEL WITH GFR
BUN: 10 mg/dL (ref 7–25)
CO2: 25 mmol/L (ref 20–32)
Calcium: 8.6 mg/dL (ref 8.6–10.4)
Chloride: 103 mmol/L (ref 98–110)
Creat: 0.8 mg/dL (ref 0.50–0.99)
GFR, Est African American: 88 mL/min/{1.73_m2} (ref >=60)
GFR, Est Non African American: 76 mL/min/{1.73_m2} (ref >=60)
Glucose, Bld: 89 mg/dL (ref 65–99)
Potassium: 4.4 mmol/L (ref 3.5–5.3)
Sodium: 135 mmol/L (ref 135–146)

## 2020-09-27 LAB — OSMOLALITY, URINE: Osmolality, Ur: 178 mOsm/kg (ref 50–1200)

## 2020-09-27 LAB — SODIUM, URINE, RANDOM: Sodium, Ur: 39 mmol/L (ref 28–272)

## 2020-10-02 ENCOUNTER — Ambulatory Visit: Payer: Medicare Other | Admitting: Family Medicine

## 2020-10-17 ENCOUNTER — Other Ambulatory Visit: Payer: Self-pay | Admitting: *Deleted

## 2020-10-17 MED ORDER — LOSARTAN POTASSIUM 50 MG PO TABS: 100.0000 mg | ORAL_TABLET | Freq: Every day | ORAL | 1 refills | Status: DC

## 2020-10-17 NOTE — Progress Notes (Signed)
Fax came in from pt's pharmacy stating that Losartan 100 mg is on backorder. Requesting that 50 mg BID be sent.

## 2020-11-03 ENCOUNTER — Ambulatory Visit (INDEPENDENT_AMBULATORY_CARE_PROVIDER_SITE_OTHER): Payer: Medicare Other | Admitting: Physician Assistant

## 2020-11-03 ENCOUNTER — Encounter: Payer: Self-pay | Admitting: Physician Assistant

## 2020-11-03 ENCOUNTER — Other Ambulatory Visit: Payer: Self-pay

## 2020-11-03 VITALS — BP 194/65 | HR 60 | Ht 61.0 in | Wt 168.0 lb

## 2020-11-03 DIAGNOSIS — E519 Thiamine deficiency, unspecified: Secondary | ICD-10-CM | POA: Diagnosis not present

## 2020-11-03 DIAGNOSIS — R5383 Other fatigue: Secondary | ICD-10-CM

## 2020-11-03 DIAGNOSIS — Z789 Other specified health status: Secondary | ICD-10-CM

## 2020-11-03 DIAGNOSIS — K130 Diseases of lips: Secondary | ICD-10-CM

## 2020-11-03 DIAGNOSIS — F1090 Alcohol use, unspecified, uncomplicated: Secondary | ICD-10-CM

## 2020-11-03 DIAGNOSIS — R682 Dry mouth, unspecified: Secondary | ICD-10-CM

## 2020-11-03 DIAGNOSIS — Z7289 Other problems related to lifestyle: Secondary | ICD-10-CM | POA: Diagnosis not present

## 2020-11-03 DIAGNOSIS — D518 Other vitamin B12 deficiency anemias: Secondary | ICD-10-CM | POA: Diagnosis not present

## 2020-11-03 MED ORDER — LIDOCAINE 5 % EX OINT: 1.0000 "application " | TOPICAL_OINTMENT | Freq: Two times a day (BID) | CUTANEOUS | 0 refills | Status: DC | PRN

## 2020-11-03 NOTE — Progress Notes (Addendum)
Established Patient Office Visit  Subjective:  Patient ID: Alison [name], female    DOB: 1951/03/17  Age: 70 y.o. MRN: [id number]  CC:  Chief Complaint  Patient presents with  . Follow-up    HPI Alison [name] presents for continued cracked lips for the past 4 months.  She states that previous remedies tried have not worked for her, including, triamcinolone,  Vaseline and OTC cold sore topicals.  Her lips are burning and painful, and it hurts to speak, smile, eat, and drink.  The pain makes it difficult for her to sleep.  Every couple days she reports her lips scabbing over, which is both painful and makes her self conscious about how they look while she is at work.  She is also reporting tongue pain.    She states that she has tried everything, including what she remembers from previous appointments.  She reports that she hasn't made any changes to her diet that she believes may have triggered this irritation.  She also denies getting B1 and Folate supplements and had difficulty remembering being instructed to do so at previous appointments.  She has stopped going to a tanning salon because she thought that the dryness might be due to some reaction to the UV light or something else there, and nothing has improved.  She reports that she is drinking 1 bottle of wine every couple days, and that she has switched back and forth between white and red wine to see if that was affecting her lips, but noticed no change.    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  She denies numbness or tingling in extremities, visual changes, rash, hives, dry skin, difficulty controlling extremities.     Past Medical History:  Diagnosis Date  . Endometriosis     Past Surgical History:  Procedure Laterality Date  . ABDOMINAL HYSTERECTOMY      Family History  Problem Relation Age of Onset  . Cancer Mother   . Hyperlipidemia Mother   .  Hypertension Mother   . Alcohol abuse Father   . Cancer Other   . Heart attack Other   . Diabetes Other   . Stroke Other     Social History   Socioeconomic History  . Marital status: Single    Spouse name: Not on file  . Number of children: 1  . Years of education: 41  . Highest education level: 12th grade  Occupational History  . Occupation: Lawyer  Tobacco Use  . Smoking status: Never Smoker  . Smokeless tobacco: Never Used  Vaping Use  . Vaping Use: Never used  Substance and Sexual Activity  . Alcohol use: Not Currently  . Drug use: No  . Sexual activity: Yes    Comment: works FT, doesn't regularly exercise  Other Topics Concern  . Not on file  Social History Narrative   Own candle in [place]   Social Determinants of Health   Financial Resource Strain: Not on file  Food Insecurity: Not on file  Transportation Needs: Not on file  Physical Activity: Not on file  Stress: Not on file  Social Connections: Not on file  Intimate Partner Violence: Not on file    Outpatient Medications Prior to Visit  Medication Sig Dispense Refill  . esomeprazole (NEXIUM) 20 MG capsule Take by mouth.    . losartan (COZAAR) 50 MG tablet Take 2 tablets (100 mg total) by mouth  daily. 180 tablet 1  . metoprolol succinate (TOPROL-XL) 100 MG 24 hr tablet TAKE 1 TABLET BY MOUTH ONCE DAILY TAKE  WITH  OR  IMMEDIATELY  FOLLOWING  A  MEAL 90 tablet 0  . PREMARIN vaginal cream Place vaginally.    . triamcinolone (KENALOG) 0.1 % paste Use as directed 1 application in the mouth or throat 2 (two) times daily. 5 g 1   No facility-administered medications prior to visit.    No Known Allergies  ROS Review of Systems  Constitutional: Positive for appetite change and fatigue. Negative for activity change, chills and fever.       She has been eating less due to pain in lips.    HENT: Negative for congestion, drooling, hearing loss, sinus pain, sneezing and sore throat.         Cracked, painful lips that scab over.  Pain in tongue.    Eyes: Negative for photophobia, pain, redness, itching and visual disturbance.  Respiratory: Negative for cough, chest tightness, shortness of breath and wheezing.   Cardiovascular: Negative for chest pain and palpitations.  Gastrointestinal: Negative for abdominal pain, nausea and vomiting.  Endocrine: Negative for cold intolerance.  Genitourinary: Negative for frequency and urgency.  Musculoskeletal: Negative for arthralgias, back pain, joint swelling and myalgias.  Skin: Negative for color change, rash and wound.  Neurological: Negative for dizziness, tremors, weakness, numbness and headaches.  Psychiatric/Behavioral: Positive for sleep disturbance. Negative for agitation, confusion and dysphoric mood. The patient is not nervous/anxious.       Objective:    Physical Exam Constitutional:      Appearance: Normal appearance.  HENT:     Head: Normocephalic.     Mouth/Throat:     Lips: Lesions present.     Mouth: Mucous membranes are moist.     Dentition: Abnormal dentition. No gingival swelling or gum lesions.     Tongue: No lesions. Tongue does not deviate from midline.     Comments: Mildly cracked and dry lips.   Cardiovascular:     Rate and Rhythm: Normal rate and regular rhythm.     Pulses: Normal pulses.     Heart sounds: Normal heart sounds.  Pulmonary:     Effort: Pulmonary effort is normal.     Breath sounds: Normal breath sounds.  Abdominal:     General: Abdomen is flat. There is no distension.  Musculoskeletal:        General: Normal range of motion.     Cervical back: Normal range of motion.  Skin:    General: Skin is warm and dry.     Capillary Refill: Capillary refill takes less than 2 seconds.  Neurological:     General: No focal deficit present.     Mental Status: She is alert and oriented to person, place, and time. Mental status is at baseline.  Psychiatric:        Mood and Affect: Mood normal.         Behavior: Behavior normal.     BP (!) 194/65   Pulse 60   Ht 5\' 1"  (1.549 m)   Wt 168 lb (76.2 kg)   SpO2 100%   BMI 31.74 kg/m  Wt Readings from Last 3 Encounters:  11/03/20 168 lb (76.2 kg)  09/11/20 168 lb (76.2 kg)  08/24/20 169 lb (76.7 kg)     Health Maintenance Due  Topic Date Due  . Hepatitis C Screening  Never done    There are no preventive care  reminders to display for this patient.  Lab Results  Component Value Date   TSH 2.35 11/03/2020   Lab Results  Component Value Date   WBC 6.9 11/03/2020   HGB 12.4 11/03/2020   HCT 35.5 11/03/2020   MCV 107.3 (H) 11/03/2020   PLT 239 11/03/2020   Lab Results  Component Value Date   NA 136 11/03/2020   K 5.1 11/03/2020   CO2 27 11/03/2020   GLUCOSE 110 (H) 11/03/2020   BUN 18 11/03/2020   CREATININE 1.32 (H) 11/03/2020   BILITOT 0.8 11/03/2020   ALKPHOS 76 01/31/2016   AST 13 11/03/2020   ALT 7 11/03/2020   PROT 6.3 11/03/2020   ALBUMIN 3.6 01/31/2016   CALCIUM 9.4 11/03/2020   Lab Results  Component Value Date   CHOL 208 (H) 01/19/2020   Lab Results  Component Value Date   HDL 100 01/19/2020   Lab Results  Component Value Date   LDLCALC 92 01/19/2020   Lab Results  Component Value Date   TRIG 75 01/19/2020   Lab Results  Component Value Date   CHOLHDL 2.1 01/19/2020   Lab Results  Component Value Date   HGBA1C 5.4 01/19/2020      Assessment & Plan:  Marland KitchenMarland KitchenRobie was seen today for follow-up.  Diagnoses and all orders for this visit:  Cheilitis -     CBC -     B12 and Folate Panel -     Vitamin B1 -     COMPLETE METABOLIC PANEL WITH GFR -     Methylmalonic Acid -     Homocysteine -     TSH -     ANA -     Ambulatory referral to Dermatology  Vitamin B1 deficiency -     Vitamin B1  Alcohol use -     B12 and Folate Panel -     Vitamin B1 -     COMPLETE METABOLIC PANEL WITH GFR -     Methylmalonic Acid -     Homocysteine  No energy -     CBC -     TSH -      ANA  Dry mouth -     ANA  Other orders -     lidocaine (XYLOCAINE) 5 % ointment; Apply 1 application topically 2 (two) times daily as needed.    Chelitis:   Xylocaine 5% topical for immediate pain relief.    Urgent dermatology referral due to extent of discomfort.    Education provided to patient that B vitamin deficiencies, such as B1 that she had previously been diagnosed with, can present as cracked lips or pain in the tongue.   Blood work recommended to patient for further workup:  Due to regular use of alcohol and previous abnormal B1 and folate levels, CBC w/ diff in addition to B1, Folate, and B12 with methylmalonic acid and homocystine ordered.  Due to yet to be established cause of chelitis, ANA, TSH, and CMP ordered.  Patient is hesitant to get more bloodwork drawn, but will consider it.    Additionally, patient recommended to start B1 (100 mg QD PO), B12 (1000 mcg QD PO), and Folate (1000 mg QD PO) supplements.    B1/Folate deficiency:   Labs and supplementation recommended as above.        Follow-up: Return if symptoms worsen or fail to improve, for with PCP.    Marland KitchenHarlon Flor PA-C, have reviewed and agree with the above documentation in  it's entirety.   Spent 30 minutes with patient discussing causes of lip pain/irritation and plan for the future but that she needs to consider decreasing alcohol intake and TAKE her vitamins.

## 2020-11-03 NOTE — Patient Instructions (Addendum)
Thiamine 100mg  daily B1 1000mg  daily of B12 1mg  of folic acid  Thiamine, Vitamin B1 tablets What is this medicine? THIAMINE (THAHY uh min) is a vitamin B1. It is added to a healthy diet to prevent or to treat low vitamin B1 levels. This vitamin may be used for other purposes; ask your health care provider or pharmacist if you have questions. This medicine may be used for other purposes; ask your health care provider or pharmacist if you have questions. What should I tell my health care provider before I take this medicine? They need to know if you have any of the following conditions:  Wernicke's disease  an unusual or allergic reaction to B vitamins, other medicines, foods, dyes, or preservatives  pregnant or trying to get pregnant  breast-feeding How should I use this medicine? Take this medicine by mouth with a glass of water. Follow the directions on the package or prescription label. For best results take this medicine with food. Take your medicine at regular intervals. Do not take your medicine more often than directed. Talk to your pediatrician regarding the use of this medicine in children. While this medicine may be prescribed for selected conditions, precautions do apply. Overdosage: If you think you have taken too much of this medicine contact a poison control center or emergency room at once. NOTE: This medicine is only for you. Do not share this medicine with others. What if I miss a dose? If you miss a dose, take it as soon as you can. If it is almost time for your next dose, take only that dose. Do not take double or extra doses. What may interact with this medicine? Interactions are not expected. This list may not describe all possible interactions. Give your health care provider a list of all the medicines, herbs, non-prescription drugs, or dietary supplements you use. Also tell them if you smoke, drink alcohol, or use illegal drugs. Some items may interact with your  medicine. What should I watch for while using this medicine? Follow a healthy diet. Taking a vitamin supplement does not replace the need for a balanced diet. Some foods that have this vitamin naturally are yeast, beans, peas, nuts, pork, and beef. Limit alcohol, smoking and stress. Too much of this vitamin can be unsafe. Talk to your doctor or health care provider about how much is right for you. What side effects may I notice from receiving this medicine? Side effects that you should report to your doctor or health care professional as soon as possible:  allergic reactions like skin rash, itching or hives, swelling of the face, lips, or tongue  chest tightness  fast, irregular heartbeat  irritable, restless  nausea, vomiting  sweating  unusually bleeding or bruising Side effects that usually do not require medical attention (report to your doctor or health care professional if they continue or are bothersome):  sneezing This list may not describe all possible side effects. Call your doctor for medical advice about side effects. You may report side effects to FDA at 1-800-FDA-1088. Where should I keep my medicine? Keep out of the reach of children. Store at room temperature between 15 and 30 degrees C (59 and 85 degrees F). Protect from heat and light. Throw away any unused medicine after the expiration date. NOTE: This sheet is a summary. It may not cover all possible information. If you have questions about this medicine, talk to your doctor, pharmacist, or health care provider.  2021 Elsevier/Gold Standard (2008-06-09 12:50:29)

## 2020-11-06 ENCOUNTER — Encounter: Payer: Self-pay | Admitting: Physician Assistant

## 2020-11-06 DIAGNOSIS — Z7289 Other problems related to lifestyle: Secondary | ICD-10-CM | POA: Insufficient documentation

## 2020-11-06 DIAGNOSIS — R682 Dry mouth, unspecified: Secondary | ICD-10-CM | POA: Insufficient documentation

## 2020-11-06 DIAGNOSIS — Z789 Other specified health status: Secondary | ICD-10-CM | POA: Insufficient documentation

## 2020-11-06 DIAGNOSIS — K130 Diseases of lips: Secondary | ICD-10-CM | POA: Insufficient documentation

## 2020-11-06 LAB — ANTI-NUCLEAR AB-TITER (ANA TITER): ANA Titer 1: 1:80 {titer} — ABNORMAL HIGH

## 2020-11-06 LAB — ANA: Anti Nuclear Antibody (ANA): POSITIVE — AB

## 2020-11-06 NOTE — Progress Notes (Signed)
Alison [name],   Thyroid is perfect.   B12 is low and homocysteine is very elevated meaning your b12 and folate vitamins are deficient. Stay on the oral vitamins that I suggested but I think you also need to come in for monthly B12 shots for a while we can recheck in 2 months and see how you feel.

## 2020-11-07 ENCOUNTER — Other Ambulatory Visit: Payer: Self-pay

## 2020-11-07 ENCOUNTER — Other Ambulatory Visit: Payer: Self-pay | Admitting: Physician Assistant

## 2020-11-07 ENCOUNTER — Ambulatory Visit (INDEPENDENT_AMBULATORY_CARE_PROVIDER_SITE_OTHER): Payer: Medicare Other | Admitting: Family Medicine

## 2020-11-07 VITALS — BP 131/62 | HR 60

## 2020-11-07 DIAGNOSIS — K130 Diseases of lips: Secondary | ICD-10-CM

## 2020-11-07 DIAGNOSIS — E538 Deficiency of other specified B group vitamins: Secondary | ICD-10-CM | POA: Diagnosis not present

## 2020-11-07 DIAGNOSIS — R682 Dry mouth, unspecified: Secondary | ICD-10-CM

## 2020-11-07 DIAGNOSIS — R768 Other specified abnormal immunological findings in serum: Secondary | ICD-10-CM

## 2020-11-07 LAB — COMPLETE METABOLIC PANEL WITH GFR
AG Ratio: 1.7 (calc) (ref 1.0–2.5)
ALT: 7 U/L (ref 6–29)
AST: 13 U/L (ref 10–35)
Albumin: 4 g/dL (ref 3.6–5.1)
Alkaline phosphatase (APISO): 102 U/L (ref 37–153)
BUN/Creatinine Ratio: 14 (calc) (ref 6–22)
BUN: 18 mg/dL (ref 7–25)
CO2: 27 mmol/L (ref 20–32)
Calcium: 9.4 mg/dL (ref 8.6–10.4)
Chloride: 100 mmol/L (ref 98–110)
Creat: 1.32 mg/dL — ABNORMAL HIGH (ref 0.50–0.99)
GFR, Est African American: 48 mL/min/{1.73_m2} — ABNORMAL LOW (ref >=60)
GFR, Est Non African American: 41 mL/min/{1.73_m2} — ABNORMAL LOW (ref >=60)
Globulin: 2.3 g/dL (calc) (ref 1.9–3.7)
Glucose, Bld: 110 mg/dL — ABNORMAL HIGH (ref 65–99)
Potassium: 5.1 mmol/L (ref 3.5–5.3)
Sodium: 136 mmol/L (ref 135–146)
Total Bilirubin: 0.8 mg/dL (ref 0.2–1.2)
Total Protein: 6.3 g/dL (ref 6.1–8.1)

## 2020-11-07 LAB — METHYLMALONIC ACID, SERUM: Methylmalonic Acid, Quant: 652 nmol/L — ABNORMAL HIGH (ref 87–318)

## 2020-11-07 LAB — B12 AND FOLATE PANEL
Folate: 2.7 ng/mL — ABNORMAL LOW
Vitamin B-12: 206 pg/mL (ref 200–1100)

## 2020-11-07 LAB — CBC
HCT: 35.5 % (ref 35.0–45.0)
Hemoglobin: 12.4 g/dL (ref 11.7–15.5)
MCH: 37.5 pg — ABNORMAL HIGH (ref 27.0–33.0)
MCHC: 34.9 g/dL (ref 32.0–36.0)
MCV: 107.3 fL — ABNORMAL HIGH (ref 80.0–100.0)
MPV: 10.4 fL (ref 7.5–12.5)
Platelets: 239 10*3/uL (ref 140–400)
RBC: 3.31 10*6/uL — ABNORMAL LOW (ref 3.80–5.10)
RDW: 14.5 % (ref 11.0–15.0)
WBC: 6.9 10*3/uL (ref 3.8–10.8)

## 2020-11-07 LAB — HOMOCYSTEINE: Homocysteine: 100.4 umol/L — ABNORMAL HIGH (ref <10.4)

## 2020-11-07 LAB — VITAMIN B1: Vitamin B1 (Thiamine): <6 nmol/L — ABNORMAL LOW (ref 8–30)

## 2020-11-07 LAB — TSH: TSH: 2.35 mIU/L (ref 0.40–4.50)

## 2020-11-07 MED ORDER — CYANOCOBALAMIN 1000 MCG/ML IJ SOLN
1000.0000 ug | Freq: Once | INTRAMUSCULAR | Status: AC
  Administered 2020-11-07: 1000 ug via INTRAMUSCULAR

## 2020-11-07 NOTE — Progress Notes (Signed)
I am referring you to rheumatology due to elevated ANA level.

## 2020-11-07 NOTE — Progress Notes (Signed)
Agree with documentation as above.   Catherine Metheney, MD  

## 2020-11-07 NOTE — Progress Notes (Signed)
Established Patient Office Visit  Subjective:  Patient ID: Alison [name], female    DOB: [birth year]  Age: 70 y.o. MRN: [id number]  CC:  Chief Complaint  Patient presents with  . Injections    HPI [name] is here for a vitamin B 12 injection. Denies muscle cramps, weakness or irregular heart rate.   Past Medical History:  Diagnosis Date  . Endometriosis     Past Surgical History:  Procedure Laterality Date  . ABDOMINAL HYSTERECTOMY      Family History  Problem Relation Age of Onset  . Cancer Mother   . Hyperlipidemia Mother   . Hypertension Mother   . Alcohol abuse Father   . Cancer Other   . Heart attack Other   . Diabetes Other   . Stroke Other     Social History   Socioeconomic History  . Marital status: Single    Spouse name: Not on file  . Number of children: 1  . Years of education: 75  . Highest education level: 12th grade  Occupational History  . Occupation: Lawyer  Tobacco Use  . Smoking status: Never Smoker  . Smokeless tobacco: Never Used  Vaping Use  . Vaping Use: Never used  Substance and Sexual Activity  . Alcohol use: Not Currently  . Drug use: No  . Sexual activity: Yes    Comment: works FT, doesn't regularly exercise  Other Topics Concern  . Not on file  Social History Narrative   Own candle in [place]   Social Determinants of Health   Financial Resource Strain: Not on file  Food Insecurity: Not on file  Transportation Needs: Not on file  Physical Activity: Not on file  Stress: Not on file  Social Connections: Not on file  Intimate Partner Violence: Not on file    Outpatient Medications Prior to Visit  Medication Sig Dispense Refill  . esomeprazole (NEXIUM) 20 MG capsule Take by mouth.    . lidocaine (XYLOCAINE) 5 % ointment Apply 1 application topically 2 (two) times daily as needed. 50 g 0  . losartan (COZAAR) 50 MG tablet Take 2 tablets (100 mg total) by mouth daily. 180 tablet 1  . metoprolol  succinate (TOPROL-XL) 100 MG 24 hr tablet TAKE 1 TABLET BY MOUTH ONCE DAILY TAKE  WITH  OR  IMMEDIATELY  FOLLOWING  A  MEAL 90 tablet 0  . PREMARIN vaginal cream Place vaginally.    . triamcinolone (KENALOG) 0.1 % paste Use as directed 1 application in the mouth or throat 2 (two) times daily. 5 g 1   No facility-administered medications prior to visit.    No Known Allergies  ROS Review of Systems    Objective:    Physical Exam  BP 131/62   Pulse 60   SpO2 100%  Wt Readings from Last 3 Encounters:  11/03/20 168 lb (76.2 kg)  09/11/20 168 lb (76.2 kg)  08/24/20 169 lb (76.7 kg)     Health Maintenance Due  Topic Date Due  . Hepatitis C Screening  Never done    There are no preventive care reminders to display for this patient.  Lab Results  Component Value Date   TSH 2.35 11/03/2020   Lab Results  Component Value Date   WBC 6.9 11/03/2020   HGB 12.4 11/03/2020   HCT 35.5 11/03/2020   MCV 107.3 (H) 11/03/2020   PLT 239 11/03/2020   Lab Results  Component Value Date   NA 136 11/03/2020  K 5.1 11/03/2020   CO2 27 11/03/2020   GLUCOSE 110 (H) 11/03/2020   BUN 18 11/03/2020   CREATININE 1.32 (H) 11/03/2020   BILITOT 0.8 11/03/2020   ALKPHOS 76 01/31/2016   AST 13 11/03/2020   ALT 7 11/03/2020   PROT 6.3 11/03/2020   ALBUMIN 3.6 01/31/2016   CALCIUM 9.4 11/03/2020   Lab Results  Component Value Date   CHOL 208 (H) 01/19/2020   Lab Results  Component Value Date   HDL 100 01/19/2020   Lab Results  Component Value Date   LDLCALC 92 01/19/2020   Lab Results  Component Value Date   TRIG 75 01/19/2020   Lab Results  Component Value Date   CHOLHDL 2.1 01/19/2020   Lab Results  Component Value Date   HGBA1C 5.4 01/19/2020      Assessment & Plan:  Low B12 - Patient tolerated injection well without complications. Patient advised to schedule next injection 30 days from today.    Problem List Items Addressed This Visit   None   Visit  Diagnoses    Low serum vitamin B12    -  Primary   Relevant Medications   cyanocobalamin ((VITAMIN B-12)) injection 1,000 mcg (Completed) (Start on 11/07/2020  9:30 AM)      Meds ordered this encounter  Medications  . cyanocobalamin ((VITAMIN B-12)) injection 1,000 mcg    Follow-up: Return in about 4 weeks (around 12/05/2020) for B 12 injection and lab work. Earna Coder, Janalyn Harder, CMA

## 2020-11-07 NOTE — Progress Notes (Signed)
Alison [name],   Thiamine came back and really low. Need to be on at least 100mg  daily of B1.

## 2020-11-08 DIAGNOSIS — B370 Candidal stomatitis: Secondary | ICD-10-CM | POA: Diagnosis not present

## 2020-11-14 ENCOUNTER — Other Ambulatory Visit: Payer: Self-pay | Admitting: Family Medicine

## 2020-11-15 DIAGNOSIS — L0100 Impetigo, unspecified: Secondary | ICD-10-CM | POA: Diagnosis not present

## 2020-11-17 ENCOUNTER — Ambulatory Visit (INDEPENDENT_AMBULATORY_CARE_PROVIDER_SITE_OTHER): Payer: Medicare Other | Admitting: Sports Medicine

## 2020-11-17 ENCOUNTER — Ambulatory Visit (INDEPENDENT_AMBULATORY_CARE_PROVIDER_SITE_OTHER): Payer: Medicare Other

## 2020-11-17 ENCOUNTER — Other Ambulatory Visit: Payer: Self-pay

## 2020-11-17 DIAGNOSIS — M170 Bilateral primary osteoarthritis of knee: Secondary | ICD-10-CM

## 2020-11-17 NOTE — Progress Notes (Signed)
    Procedures performed today:    Procedure: Real-time Ultrasound Guided injection of the left knee Device: Samsung HS60  Verbal informed consent obtained.  Time-out conducted.  Noted no overlying erythema, induration, or other signs of local infection.  Skin prepped in a sterile fashion.  Local anesthesia: Topical Ethyl chloride.  With sterile technique and under real time ultrasound guidance:  Noted mild effusion, 1 cc Kenalog 40, 2 cc lidocaine, 2 cc bupivacaine injected easily Completed without difficulty  Advised to call if fevers/chills, erythema, induration, drainage, or persistent bleeding.  Images permanently stored and available for review in PACS.  Impression: Technically successful ultrasound guided injection.  Procedure: Real-time Ultrasound Guided injection of the right knee Device: Samsung HS60  Verbal informed consent obtained.  Time-out conducted.  Noted no overlying erythema, induration, or other signs of local infection.  Skin prepped in a sterile fashion.  Local anesthesia: Topical Ethyl chloride.  With sterile technique and under real time ultrasound guidance:  Noted mild effusion, 1 cc Kenalog 40, 2 cc lidocaine, 2 cc bupivacaine injected easily Completed without difficulty  Advised to call if fevers/chills, erythema, induration, drainage, or persistent bleeding.  Images permanently stored and available for review in PACS.  Impression: Technically successful ultrasound guided injection.  Independent interpretation of notes and tests performed by another provider:   None.  Brief History, Exam, Impression, and Recommendations:    Primary osteoarthritis of both knees post genicular RFA Alison [name] returns, she has bilateral knee osteoarthritis, she is post several injections, viscosupplementation, bilateral genicular radiofrequency ablation in December 2020 with moderate relief, her last steroid injection was in May of last year, repeated today. We are going to  have her try radiofrequency ablation again. Referral back to Dr. Roderic Ovens.    ___________________________________________ Alison [name]. Alison [name], M.D., ABFM., CAQSM. Primary Care and Sports Medicine [facility]  Adjunct Instructor of Family Medicine  [employer]

## 2020-11-17 NOTE — Assessment & Plan Note (Addendum)
Alison [name] returns, she has bilateral knee osteoarthritis, she is post several injections, viscosupplementation, bilateral genicular radiofrequency ablation in December 2020 with moderate relief, her last steroid injection was in May of last year, repeated today. We are going to have her try radiofrequency ablation again. Referral back to Dr. Roderic Ovens.

## 2020-11-21 ENCOUNTER — Other Ambulatory Visit: Payer: Self-pay | Admitting: Family Medicine

## 2020-12-05 ENCOUNTER — Ambulatory Visit (INDEPENDENT_AMBULATORY_CARE_PROVIDER_SITE_OTHER): Payer: Medicare Other | Admitting: Family Medicine

## 2020-12-05 ENCOUNTER — Other Ambulatory Visit: Payer: Self-pay

## 2020-12-05 ENCOUNTER — Encounter: Payer: Self-pay | Admitting: Family Medicine

## 2020-12-05 ENCOUNTER — Ambulatory Visit: Payer: Medicare Other

## 2020-12-05 VITALS — BP 107/35 | HR 77 | Ht 61.0 in | Wt 163.0 lb

## 2020-12-05 DIAGNOSIS — R7301 Impaired fasting glucose: Secondary | ICD-10-CM | POA: Diagnosis not present

## 2020-12-05 DIAGNOSIS — E538 Deficiency of other specified B group vitamins: Secondary | ICD-10-CM | POA: Diagnosis not present

## 2020-12-05 DIAGNOSIS — E519 Thiamine deficiency, unspecified: Secondary | ICD-10-CM

## 2020-12-05 DIAGNOSIS — I10 Essential (primary) hypertension: Secondary | ICD-10-CM | POA: Diagnosis not present

## 2020-12-05 DIAGNOSIS — F1010 Alcohol abuse, uncomplicated: Secondary | ICD-10-CM

## 2020-12-05 LAB — POCT GLYCOSYLATED HEMOGLOBIN (HGB A1C): Hemoglobin A1C: 5.4 % (ref 4.0–5.6)

## 2020-12-05 MED ORDER — CYANOCOBALAMIN 1000 MCG/ML IJ SOLN
1000.0000 ug | Freq: Once | INTRAMUSCULAR | Status: AC
  Administered 2020-12-05: 1000 ug via INTRAMUSCULAR

## 2020-12-05 MED ORDER — NALTREXONE HCL 50 MG PO TABS: 50.0000 mg | ORAL_TABLET | Freq: Every day | ORAL | 5 refills | Status: DC

## 2020-12-05 NOTE — Assessment & Plan Note (Signed)
Well controlled. Continue current regimen. Follow up in  6 mo  

## 2020-12-05 NOTE — Patient Instructions (Signed)
Schedule weekly B12 injection x 1 months and then every 2 weeks for 2 months.

## 2020-12-05 NOTE — Assessment & Plan Note (Signed)
She would like to restart naltrexone.  Prescription sent to pharmacy.  Urged her to continue to work at it.  It sounds like she has some motivation now which is good.

## 2020-12-05 NOTE — Progress Notes (Signed)
Established Patient Office Visit  Subjective:  Patient ID: Alison [name], female    DOB: 04-24-1951  Age: 70 y.o. MRN: [id number]  CC:  Chief Complaint  Patient presents with  . Follow-up  . b 12 injection    HPI Alison [name] presents for low up of several nutritional deficiency secondary to alcohol use.  Follow-up B12 deficiency-she has been coming in for B12 injections every 30 days.   Follow-up vitamin B1 deficiency -she says she did pick up a bottle and has taken it somewhat but not consistently.  Follow-up folate deficiency -thing for the folate.  She is taking it here there but not consistently.  He reports that she still just does not feel well.  In fact she actually stopped drinking alcohol about 7 days ago.  She feels motivated by her ex who actually quit drinking alcohol in December and had encouraged her to stop as well.  She would like a prescription medication to help her abstain.  Impaired fasting glucose-no increased thirst or urination. No symptoms consistent with hypoglycemia.   Past Medical History:  Diagnosis Date  . Endometriosis     Past Surgical History:  Procedure Laterality Date  . ABDOMINAL HYSTERECTOMY      Family History  Problem Relation Age of Onset  . Cancer Mother   . Hyperlipidemia Mother   . Hypertension Mother   . Alcohol abuse Father   . Cancer Other   . Heart attack Other   . Diabetes Other   . Stroke Other     Social History   Socioeconomic History  . Marital status: Single    Spouse name: Not on file  . Number of children: 1  . Years of education: 88  . Highest education level: 12th grade  Occupational History  . Occupation: Lawyer  Tobacco Use  . Smoking status: Never Smoker  . Smokeless tobacco: Never Used  Vaping Use  . Vaping Use: Never used  Substance and Sexual Activity  . Alcohol use: Not Currently  . Drug use: No  . Sexual activity: Yes    Comment: works FT, doesn't regularly exercise   Other Topics Concern  . Not on file  Social History Narrative   Own candle in [place]   Social Determinants of Health   Financial Resource Strain: Not on file  Food Insecurity: Not on file  Transportation Needs: Not on file  Physical Activity: Not on file  Stress: Not on file  Social Connections: Not on file  Intimate Partner Violence: Not on file    Outpatient Medications Prior to Visit  Medication Sig Dispense Refill  . esomeprazole (NEXIUM) 20 MG capsule Take by mouth.    . hydrocortisone 2.5 % ointment Apply topically.    . lidocaine (XYLOCAINE) 5 % ointment Apply 1 application topically 2 (two) times daily as needed. 50 g 0  . losartan (COZAAR) 50 MG tablet Take 2 tablets (100 mg total) by mouth daily. 180 tablet 1  . metoprolol succinate (TOPROL-XL) 100 MG 24 hr tablet TAKE 1 TABLET BY MOUTH ONCE DAILY .  TAKE  WITH  OR  IMMEDIATELY  FOLLOWING  A  MEAL 90 tablet 0  . mupirocin ointment (BACTROBAN) 2 % Apply topically 2 (two) times daily.  2  . nystatin (MYCOSTATIN) 100000 UNIT/ML suspension SMARTSIG:5 Milliliter(s) By Mouth 3-4 Times Daily  2  . PREMARIN vaginal cream INSERT 1 APPLICATORFUL VAGINALLY ONCE DAILY FOR  TWO  WEEKS  THEN  DECREASE  TO  TWICE  A  WEEK 30 g 0  . triamcinolone (KENALOG) 0.1 % paste Use as directed 1 application in the mouth or throat 2 (two) times daily. 5 g 1   No facility-administered medications prior to visit.    No Known Allergies  ROS Review of Systems    Objective:    Physical Exam Constitutional:      Appearance: She is well-developed and well-nourished.  HENT:     Head: Normocephalic and atraumatic.  Cardiovascular:     Rate and Rhythm: Normal rate and regular rhythm.     Heart sounds: Normal heart sounds.  Pulmonary:     Effort: Pulmonary effort is normal.     Breath sounds: Normal breath sounds.  Skin:    General: Skin is warm and dry.  Neurological:     Mental Status: She is alert and oriented to person, place,  and time.  Psychiatric:        Mood and Affect: Mood and affect normal.        Behavior: Behavior normal.     BP (!) 107/35   Pulse 77   Ht 5\' 1"  (1.549 m)   Wt 163 lb (73.9 kg)   SpO2 100%   BMI 30.80 kg/m  Wt Readings from Last 3 Encounters:  12/05/20 163 lb (73.9 kg)  11/03/20 168 lb (76.2 kg)  09/11/20 168 lb (76.2 kg)     Health Maintenance Due  Topic Date Due  . Hepatitis C Screening  Never done  . COVID-19 Vaccine (1) Never done    There are no preventive care reminders to display for this patient.  Lab Results  Component Value Date   TSH 2.35 11/03/2020   Lab Results  Component Value Date   WBC 6.9 11/03/2020   HGB 12.4 11/03/2020   HCT 35.5 11/03/2020   MCV 107.3 (H) 11/03/2020   PLT 239 11/03/2020   Lab Results  Component Value Date   NA 136 11/03/2020   K 5.1 11/03/2020   CO2 27 11/03/2020   GLUCOSE 110 (H) 11/03/2020   BUN 18 11/03/2020   CREATININE 1.32 (H) 11/03/2020   BILITOT 0.8 11/03/2020   ALKPHOS 76 01/31/2016   AST 13 11/03/2020   ALT 7 11/03/2020   PROT 6.3 11/03/2020   ALBUMIN 3.6 01/31/2016   CALCIUM 9.4 11/03/2020   Lab Results  Component Value Date   CHOL 208 (H) 01/19/2020   Lab Results  Component Value Date   HDL 100 01/19/2020   Lab Results  Component Value Date   LDLCALC 92 01/19/2020   Lab Results  Component Value Date   TRIG 75 01/19/2020   Lab Results  Component Value Date   CHOLHDL 2.1 01/19/2020   Lab Results  Component Value Date   HGBA1C 5.4 12/05/2020      Assessment & Plan:   Problem List Items Addressed This Visit      Cardiovascular and Mediastinum   HYPERTENSION, BENIGN    Well controlled. Continue current regimen. Follow up in  6 mo        Endocrine   IFG (impaired fasting glucose)    A1c looks great today.  Recommend recheck in 1 year.      Relevant Orders   POCT glycosylated hemoglobin (Hb A1C) (Completed)     Other   Vitamin B1 deficiency   Low serum vitamin B12 -  Primary   Folate deficiency   Alcohol abuse    She would like to restart naltrexone.  Prescription sent to pharmacy.  Urged her to continue to work at it.  It sounds like she has some motivation now which is good.      Relevant Medications   naltrexone (DEPADE) 50 MG tablet     B12 deficiency-Will start weekly injections x1 month and then every 2 weeks x 2 months and then recheck levels.  Folate and thiamine deficiency-encouraged her to take her supplement daily.  Explained to her that it can take weeks if not months to get those levels back to normal to see if she is feeling better.   Meds ordered this encounter  Medications  . cyanocobalamin ((VITAMIN B-12)) injection 1,000 mcg  . naltrexone (DEPADE) 50 MG tablet    Sig: Take 1 tablet (50 mg total) by mouth daily.    Dispense:  30 tablet    Refill:  5    Follow-up: Return in about 3 months (around 03/07/2021) for Hypertension and recheck mineral deficiencies. Nani Gasser, MD

## 2020-12-05 NOTE — Assessment & Plan Note (Signed)
A1c looks great today.  Recommend recheck in 1 year.

## 2020-12-05 NOTE — Progress Notes (Signed)
Pt came in today for B12 injection. Injection  tolerated well.  Pt reports no negative side effects from medication. Denies any dizziness, chest pain or palpitations, and no GI problems. Pt to rtc in 1 month for next B12 injection

## 2020-12-12 ENCOUNTER — Other Ambulatory Visit: Payer: Self-pay

## 2020-12-12 ENCOUNTER — Ambulatory Visit (INDEPENDENT_AMBULATORY_CARE_PROVIDER_SITE_OTHER): Payer: Medicare Other | Admitting: Family Medicine

## 2020-12-12 VITALS — BP 137/55 | HR 75

## 2020-12-12 DIAGNOSIS — E538 Deficiency of other specified B group vitamins: Secondary | ICD-10-CM | POA: Diagnosis not present

## 2020-12-12 MED ORDER — CYANOCOBALAMIN 1000 MCG/ML IJ SOLN
1000.0000 ug | Freq: Once | INTRAMUSCULAR | Status: AC
  Administered 2020-12-12: 1000 ug via INTRAMUSCULAR

## 2020-12-12 NOTE — Progress Notes (Signed)
Established Patient Office Visit  Subjective:  Patient ID: Alison [name], female    DOB: 1951-08-22  Age: 70 y.o. MRN: [id number]  CC:  Chief Complaint  Patient presents with  . Injections    HPI Alison [name] is here for a vitamin B 12 injection. Denies muscle cramps, weakness or irregular heart rate.   Past Medical History:  Diagnosis Date  . Endometriosis     Past Surgical History:  Procedure Laterality Date  . ABDOMINAL HYSTERECTOMY      Family History  Problem Relation Age of Onset  . Cancer Mother   . Hyperlipidemia Mother   . Hypertension Mother   . Alcohol abuse Father   . Cancer Other   . Heart attack Other   . Diabetes Other   . Stroke Other     Social History   Socioeconomic History  . Marital status: Single    Spouse name: Not on file  . Number of children: 1  . Years of education: 70  . Highest education level: 12th grade  Occupational History  . Occupation: Lawyer  Tobacco Use  . Smoking status: Never Smoker  . Smokeless tobacco: Never Used  Vaping Use  . Vaping Use: Never used  Substance and Sexual Activity  . Alcohol use: Not Currently  . Drug use: No  . Sexual activity: Yes    Comment: works FT, doesn't regularly exercise  Other Topics Concern  . Not on file  Social History Narrative   Own candle in [place]   Social Determinants of Health   Financial Resource Strain: Not on file  Food Insecurity: Not on file  Transportation Needs: Not on file  Physical Activity: Not on file  Stress: Not on file  Social Connections: Not on file  Intimate Partner Violence: Not on file    Outpatient Medications Prior to Visit  Medication Sig Dispense Refill  . esomeprazole (NEXIUM) 20 MG capsule Take by mouth.    . hydrocortisone 2.5 % ointment Apply topically.    . lidocaine (XYLOCAINE) 5 % ointment Apply 1 application topically 2 (two) times daily as needed. 50 g 0  . losartan (COZAAR) 50 MG tablet Take 2 tablets (100 mg  total) by mouth daily. 180 tablet 1  . metoprolol succinate (TOPROL-XL) 100 MG 24 hr tablet TAKE 1 TABLET BY MOUTH ONCE DAILY .  TAKE  WITH  OR  IMMEDIATELY  FOLLOWING  A  MEAL 90 tablet 0  . mupirocin ointment (BACTROBAN) 2 % Apply topically 2 (two) times daily.  2  . naltrexone (DEPADE) 50 MG tablet Take 1 tablet (50 mg total) by mouth daily. 30 tablet 5  . nystatin (MYCOSTATIN) 100000 UNIT/ML suspension SMARTSIG:5 Milliliter(s) By Mouth 3-4 Times Daily  2  . PREMARIN vaginal cream INSERT 1 APPLICATORFUL VAGINALLY ONCE DAILY FOR  TWO  WEEKS  THEN  DECREASE  TO  TWICE  A  WEEK 30 g 0  . triamcinolone (KENALOG) 0.1 % paste Use as directed 1 application in the mouth or throat 2 (two) times daily. 5 g 1   No facility-administered medications prior to visit.    No Known Allergies  ROS Review of Systems    Objective:    Physical Exam  BP (!) 165/56   Pulse 75   SpO2 100%  Wt Readings from Last 3 Encounters:  12/05/20 163 lb (73.9 kg)  11/03/20 168 lb (76.2 kg)  09/11/20 168 lb (76.2 kg)     Health Maintenance Due  Topic Date Due  . Hepatitis C Screening  Never done  . COVID-19 Vaccine (1) Never done    There are no preventive care reminders to display for this patient.  Lab Results  Component Value Date   TSH 2.35 11/03/2020   Lab Results  Component Value Date   WBC 6.9 11/03/2020   HGB 12.4 11/03/2020   HCT 35.5 11/03/2020   MCV 107.3 (H) 11/03/2020   PLT 239 11/03/2020   Lab Results  Component Value Date   NA 136 11/03/2020   K 5.1 11/03/2020   CO2 27 11/03/2020   GLUCOSE 110 (H) 11/03/2020   BUN 18 11/03/2020   CREATININE 1.32 (H) 11/03/2020   BILITOT 0.8 11/03/2020   ALKPHOS 76 01/31/2016   AST 13 11/03/2020   ALT 7 11/03/2020   PROT 6.3 11/03/2020   ALBUMIN 3.6 01/31/2016   CALCIUM 9.4 11/03/2020   Lab Results  Component Value Date   CHOL 208 (H) 01/19/2020   Lab Results  Component Value Date   HDL 100 01/19/2020   Lab Results  Component  Value Date   LDLCALC 92 01/19/2020   Lab Results  Component Value Date   TRIG 75 01/19/2020   Lab Results  Component Value Date   CHOLHDL 2.1 01/19/2020   Lab Results  Component Value Date   HGBA1C 5.4 12/05/2020      Assessment & Plan:  Low B12 - Patient tolerated injection well without complications. Patient advised to schedule next injection 7 days from today. Patient will have B12 injections once a week for 2 months.    Problem List Items Addressed This Visit    Low serum vitamin B12 - Primary      Meds ordered this encounter  Medications  . cyanocobalamin ((VITAMIN B-12)) injection 1,000 mcg    Follow-up: Return in about 1 week (around 12/19/2020) for B12 injection. Earna Coder, Janalyn Harder, CMA

## 2020-12-12 NOTE — Progress Notes (Signed)
Agree with documentation as above.   Marilea Gwynne, MD  

## 2020-12-19 ENCOUNTER — Ambulatory Visit (INDEPENDENT_AMBULATORY_CARE_PROVIDER_SITE_OTHER): Payer: Medicare Other | Admitting: Physician Assistant

## 2020-12-19 ENCOUNTER — Other Ambulatory Visit: Payer: Self-pay

## 2020-12-19 VITALS — BP 135/65 | HR 90 | Temp 98.8°F | Resp 20 | Ht 61.0 in | Wt 163.0 lb

## 2020-12-19 DIAGNOSIS — E538 Deficiency of other specified B group vitamins: Secondary | ICD-10-CM

## 2020-12-19 MED ORDER — CYANOCOBALAMIN 1000 MCG/ML IJ SOLN
1000.0000 ug | Freq: Once | INTRAMUSCULAR | Status: AC
  Administered 2020-12-19: 1000 ug via INTRAMUSCULAR

## 2020-12-19 NOTE — Progress Notes (Signed)
Established Patient Office Visit  Subjective:  Patient ID: Alison [name], female    DOB: 07-25-1951  Age: 70 y.o. MRN: [id number]  CC:  Chief Complaint  Patient presents with  . Low serum vitamin B12    HPI Alison [name] presents for a vitamin B12 injection. Denies gastrointestinal problems or dizziness. B12 injection to left deltoid with no apparent complications. Patient advised to schedule next injection in 30 days.   Past Medical History:  Diagnosis Date  . Endometriosis     Past Surgical History:  Procedure Laterality Date  . ABDOMINAL HYSTERECTOMY      Family History  Problem Relation Age of Onset  . Cancer Mother   . Hyperlipidemia Mother   . Hypertension Mother   . Alcohol abuse Father   . Cancer Other   . Heart attack Other   . Diabetes Other   . Stroke Other     Social History   Socioeconomic History  . Marital status: Single    Spouse name: Not on file  . Number of children: 1  . Years of education: 21  . Highest education level: 12th grade  Occupational History  . Occupation: Lawyer  Tobacco Use  . Smoking status: Never Smoker  . Smokeless tobacco: Never Used  Vaping Use  . Vaping Use: Never used  Substance and Sexual Activity  . Alcohol use: Not Currently  . Drug use: No  . Sexual activity: Yes    Comment: works FT, doesn't regularly exercise  Other Topics Concern  . Not on file  Social History Narrative   Own candle in [place]   Social Determinants of Health   Financial Resource Strain: Not on file  Food Insecurity: Not on file  Transportation Needs: Not on file  Physical Activity: Not on file  Stress: Not on file  Social Connections: Not on file  Intimate Partner Violence: Not on file    Outpatient Medications Prior to Visit  Medication Sig Dispense Refill  . esomeprazole (NEXIUM) 20 MG capsule Take by mouth.    . hydrocortisone 2.5 % ointment Apply topically.    . lidocaine (XYLOCAINE) 5 % ointment Apply 1  application topically 2 (two) times daily as needed. 50 g 0  . losartan (COZAAR) 50 MG tablet Take 2 tablets (100 mg total) by mouth daily. 180 tablet 1  . metoprolol succinate (TOPROL-XL) 100 MG 24 hr tablet TAKE 1 TABLET BY MOUTH ONCE DAILY .  TAKE  WITH  OR  IMMEDIATELY  FOLLOWING  A  MEAL 90 tablet 0  . mupirocin ointment (BACTROBAN) 2 % Apply topically 2 (two) times daily.  2  . naltrexone (DEPADE) 50 MG tablet Take 1 tablet (50 mg total) by mouth daily. 30 tablet 5  . nystatin (MYCOSTATIN) 100000 UNIT/ML suspension SMARTSIG:5 Milliliter(s) By Mouth 3-4 Times Daily  2  . PREMARIN vaginal cream INSERT 1 APPLICATORFUL VAGINALLY ONCE DAILY FOR  TWO  WEEKS  THEN  DECREASE  TO  TWICE  A  WEEK 30 g 0  . triamcinolone (KENALOG) 0.1 % paste Use as directed 1 application in the mouth or throat 2 (two) times daily. 5 g 1   No facility-administered medications prior to visit.    No Known Allergies  ROS Review of Systems    Objective:    Physical Exam  BP 135/65 (BP Location: Left Arm, Patient Position: Sitting, Cuff Size: Normal)   Pulse 90   Temp 98.8 F (37.1 C) (Oral)   Resp  20   Ht 5\' 1"  (1.549 m)   Wt 163 lb (73.9 kg)   SpO2 100%   BMI 30.80 kg/m  Wt Readings from Last 3 Encounters:  12/19/20 163 lb (73.9 kg)  12/05/20 163 lb (73.9 kg)  11/03/20 168 lb (76.2 kg)     Health Maintenance Due  Topic Date Due  . Hepatitis C Screening  Never done  . COVID-19 Vaccine (1) Never done    There are no preventive care reminders to display for this patient.  Lab Results  Component Value Date   TSH 2.35 11/03/2020   Lab Results  Component Value Date   WBC 6.9 11/03/2020   HGB 12.4 11/03/2020   HCT 35.5 11/03/2020   MCV 107.3 (H) 11/03/2020   PLT 239 11/03/2020   Lab Results  Component Value Date   NA 136 11/03/2020   K 5.1 11/03/2020   CO2 27 11/03/2020   GLUCOSE 110 (H) 11/03/2020   BUN 18 11/03/2020   CREATININE 1.32 (H) 11/03/2020   BILITOT 0.8 11/03/2020    ALKPHOS 76 01/31/2016   AST 13 11/03/2020   ALT 7 11/03/2020   PROT 6.3 11/03/2020   ALBUMIN 3.6 01/31/2016   CALCIUM 9.4 11/03/2020   Lab Results  Component Value Date   CHOL 208 (H) 01/19/2020   Lab Results  Component Value Date   HDL 100 01/19/2020   Lab Results  Component Value Date   LDLCALC 92 01/19/2020   Lab Results  Component Value Date   TRIG 75 01/19/2020   Lab Results  Component Value Date   CHOLHDL 2.1 01/19/2020   Lab Results  Component Value Date   HGBA1C 5.4 12/05/2020      Assessment & Plan:  B12 injection to left deltoid with no apparent complications. Patient advised to schedule next injection in 30 days.  Problem List Items Addressed This Visit      Other   Low serum vitamin B12 - Primary      Meds ordered this encounter  Medications  . cyanocobalamin ((VITAMIN B-12)) injection 1,000 mcg    Follow-up: Return in about 1 month (around 01/19/2021) for B-12 Injection.    01/21/2021, CMA

## 2020-12-19 NOTE — Progress Notes (Signed)
Patient ID: Alison [name], female   DOB: [birth year], 70 y.o.   MRN: [id number] Agree with plan.

## 2020-12-21 ENCOUNTER — Telehealth: Payer: Self-pay

## 2020-12-21 NOTE — Telephone Encounter (Signed)
Shaney called and states has vaginal itching. Denies fever, chills, sweats or pelvic pain. Refuses appointment. She would like something sent in for a yeast infection. Please advise.

## 2020-12-22 MED ORDER — FLUCONAZOLE 150 MG PO TABS: 150.0000 mg | ORAL_TABLET | Freq: Once | ORAL | 0 refills | Status: AC

## 2020-12-22 NOTE — Telephone Encounter (Signed)
Left message advising of recommendations.  

## 2020-12-22 NOTE — Telephone Encounter (Signed)
Prescription sent to [facility].  If not better then will need appointment.

## 2020-12-26 ENCOUNTER — Other Ambulatory Visit: Payer: Self-pay

## 2020-12-26 ENCOUNTER — Ambulatory Visit (INDEPENDENT_AMBULATORY_CARE_PROVIDER_SITE_OTHER): Payer: Medicare Other | Admitting: Family Medicine

## 2020-12-26 VITALS — BP 154/76 | HR 90

## 2020-12-26 DIAGNOSIS — E538 Deficiency of other specified B group vitamins: Secondary | ICD-10-CM

## 2020-12-26 MED ORDER — CYANOCOBALAMIN 1000 MCG/ML IJ SOLN
1000.0000 ug | Freq: Once | INTRAMUSCULAR | Status: AC
  Administered 2020-12-26: 1000 ug via INTRAMUSCULAR

## 2020-12-26 NOTE — Progress Notes (Signed)
Pt here for weekly B12 shots.  Given RD without immediate complications.  Her bp is elevated this morning but she did not want to stay for me to recheck it.

## 2020-12-26 NOTE — Progress Notes (Signed)
Agree with documentation as above.   Verena Shawgo, MD  

## 2021-01-02 ENCOUNTER — Ambulatory Visit: Payer: Medicare Other

## 2021-01-08 DIAGNOSIS — K219 Gastro-esophageal reflux disease without esophagitis: Secondary | ICD-10-CM | POA: Diagnosis not present

## 2021-01-08 DIAGNOSIS — I10 Essential (primary) hypertension: Secondary | ICD-10-CM | POA: Diagnosis not present

## 2021-01-08 DIAGNOSIS — Z880 Allergy status to penicillin: Secondary | ICD-10-CM | POA: Diagnosis not present

## 2021-01-08 DIAGNOSIS — R918 Other nonspecific abnormal finding of lung field: Secondary | ICD-10-CM | POA: Diagnosis not present

## 2021-01-08 DIAGNOSIS — Z79899 Other long term (current) drug therapy: Secondary | ICD-10-CM | POA: Diagnosis not present

## 2021-01-08 DIAGNOSIS — T783XXA Angioneurotic edema, initial encounter: Secondary | ICD-10-CM | POA: Diagnosis not present

## 2021-01-08 DIAGNOSIS — R0602 Shortness of breath: Secondary | ICD-10-CM | POA: Diagnosis not present

## 2021-01-09 ENCOUNTER — Ambulatory Visit (INDEPENDENT_AMBULATORY_CARE_PROVIDER_SITE_OTHER): Payer: Medicare Other | Admitting: Family Medicine

## 2021-01-09 ENCOUNTER — Other Ambulatory Visit: Payer: Self-pay

## 2021-01-09 VITALS — BP 170/52 | HR 77 | Resp 20

## 2021-01-09 DIAGNOSIS — I10 Essential (primary) hypertension: Secondary | ICD-10-CM

## 2021-01-09 DIAGNOSIS — E538 Deficiency of other specified B group vitamins: Secondary | ICD-10-CM

## 2021-01-09 MED ORDER — CYANOCOBALAMIN 1000 MCG/ML IJ SOLN
1000.0000 ug | Freq: Once | INTRAMUSCULAR | Status: AC
  Administered 2021-01-09: 1000 ug via INTRAMUSCULAR

## 2021-01-09 MED ORDER — AMLODIPINE BESYLATE 5 MG PO TABS
5.0000 mg | ORAL_TABLET | Freq: Every day | ORAL | 1 refills | Status: DC
Start: 2021-01-09 — End: 2021-03-12

## 2021-01-09 NOTE — Progress Notes (Signed)
Established Patient Office Visit  Subjective:  Patient ID: Alison [name], female    DOB: October 21, 1950  Age: 70 y.o. MRN: [id number]  CC:  Chief Complaint  Patient presents with  . Low Serum Vitamin B12    HPI Alison [name] presents for a vitamin B12 injection. Denies gastrointestinal problems or dizziness. B12 injection to left deltoid with no apparent complications. Patient advised to schedule next injection in 30 days.   Past Medical History:  Diagnosis Date  . Endometriosis     Past Surgical History:  Procedure Laterality Date  . ABDOMINAL HYSTERECTOMY      Family History  Problem Relation Age of Onset  . Cancer Mother   . Hyperlipidemia Mother   . Hypertension Mother   . Alcohol abuse Father   . Cancer Other   . Heart attack Other   . Diabetes Other   . Stroke Other     Social History   Socioeconomic History  . Marital status: Single    Spouse name: Not on file  . Number of children: 1  . Years of education: 27  . Highest education level: 12th grade  Occupational History  . Occupation: Lawyer  Tobacco Use  . Smoking status: Never Smoker  . Smokeless tobacco: Never Used  Vaping Use  . Vaping Use: Never used  Substance and Sexual Activity  . Alcohol use: Not Currently  . Drug use: No  . Sexual activity: Yes    Comment: works FT, doesn't regularly exercise  Other Topics Concern  . Not on file  Social History Narrative   Own candle in [place]   Social Determinants of Health   Financial Resource Strain: Not on file  Food Insecurity: Not on file  Transportation Needs: Not on file  Physical Activity: Not on file  Stress: Not on file  Social Connections: Not on file  Intimate Partner Violence: Not on file    Outpatient Medications Prior to Visit  Medication Sig Dispense Refill  . esomeprazole (NEXIUM) 20 MG capsule Take by mouth.    . hydrocortisone 2.5 % ointment Apply topically.    . lidocaine (XYLOCAINE) 5 % ointment Apply 1  application topically 2 (two) times daily as needed. 50 g 0  . losartan (COZAAR) 50 MG tablet Take 2 tablets (100 mg total) by mouth daily. 180 tablet 1  . metoprolol succinate (TOPROL-XL) 100 MG 24 hr tablet TAKE 1 TABLET BY MOUTH ONCE DAILY .  TAKE  WITH  OR  IMMEDIATELY  FOLLOWING  A  MEAL 90 tablet 0  . mupirocin ointment (BACTROBAN) 2 % Apply topically 2 (two) times daily.  2  . naltrexone (DEPADE) 50 MG tablet Take 1 tablet (50 mg total) by mouth daily. 30 tablet 5  . nystatin (MYCOSTATIN) 100000 UNIT/ML suspension SMARTSIG:5 Milliliter(s) By Mouth 3-4 Times Daily  2  . PREMARIN vaginal cream INSERT 1 APPLICATORFUL VAGINALLY ONCE DAILY FOR  TWO  WEEKS  THEN  DECREASE  TO  TWICE  A  WEEK 30 g 0  . triamcinolone (KENALOG) 0.1 % paste Use as directed 1 application in the mouth or throat 2 (two) times daily. 5 g 1   No facility-administered medications prior to visit.    No Known Allergies  ROS Review of Systems    Objective:    Physical Exam  There were no vitals taken for this visit. Wt Readings from Last 3 Encounters:  12/19/20 163 lb (73.9 kg)  12/05/20 163 lb (73.9 kg)  11/03/20 168 lb (76.2 kg)     Health Maintenance Due  Topic Date Due  . Hepatitis C Screening  Never done  . COVID-19 Vaccine (1) Never done    There are no preventive care reminders to display for this patient.  Lab Results  Component Value Date   TSH 2.35 11/03/2020   Lab Results  Component Value Date   WBC 6.9 11/03/2020   HGB 12.4 11/03/2020   HCT 35.5 11/03/2020   MCV 107.3 (H) 11/03/2020   PLT 239 11/03/2020   Lab Results  Component Value Date   NA 136 11/03/2020   K 5.1 11/03/2020   CO2 27 11/03/2020   GLUCOSE 110 (H) 11/03/2020   BUN 18 11/03/2020   CREATININE 1.32 (H) 11/03/2020   BILITOT 0.8 11/03/2020   ALKPHOS 76 01/31/2016   AST 13 11/03/2020   ALT 7 11/03/2020   PROT 6.3 11/03/2020   ALBUMIN 3.6 01/31/2016   CALCIUM 9.4 11/03/2020   Lab Results  Component Value  Date   CHOL 208 (H) 01/19/2020   Lab Results  Component Value Date   HDL 100 01/19/2020   Lab Results  Component Value Date   LDLCALC 92 01/19/2020   Lab Results  Component Value Date   TRIG 75 01/19/2020   Lab Results  Component Value Date   CHOLHDL 2.1 01/19/2020   Lab Results  Component Value Date   HGBA1C 5.4 12/05/2020      Assessment & Plan:  B12 injection to left deltoid with no apparent complications. Patient advised to schedule next injection in 30 days.  Problem List Items Addressed This Visit      Other   Low serum vitamin B12 - Primary      Meds ordered this encounter  Medications  . cyanocobalamin ((VITAMIN B-12)) injection 1,000 mcg    Follow-up: Return in about 1 month (around 02/08/2021) for B12 Injection.    Kathrynn Speed, CMA

## 2021-01-09 NOTE — Progress Notes (Signed)
Hypertension-blood pressure uncontrolled today but her ACE inhibitor was discontinued secondary to angioedema encouraged her to schedule a hospital follow-up in the meantime we will start 5 mg of amlodipine.  Nani Gasser, MD

## 2021-01-10 ENCOUNTER — Ambulatory Visit (INDEPENDENT_AMBULATORY_CARE_PROVIDER_SITE_OTHER): Payer: Medicare Other | Admitting: Physician Assistant

## 2021-01-10 VITALS — BP 131/84 | HR 65 | Ht 61.0 in | Wt 163.0 lb

## 2021-01-10 DIAGNOSIS — I10 Essential (primary) hypertension: Secondary | ICD-10-CM

## 2021-01-10 DIAGNOSIS — M170 Bilateral primary osteoarthritis of knee: Secondary | ICD-10-CM

## 2021-01-10 DIAGNOSIS — T783XXD Angioneurotic edema, subsequent encounter: Secondary | ICD-10-CM | POA: Diagnosis not present

## 2021-01-10 DIAGNOSIS — N760 Acute vaginitis: Secondary | ICD-10-CM

## 2021-01-10 DIAGNOSIS — T783XXA Angioneurotic edema, initial encounter: Secondary | ICD-10-CM | POA: Insufficient documentation

## 2021-01-10 MED ORDER — FLUCONAZOLE 150 MG PO TABS: 150.0000 mg | ORAL_TABLET | Freq: Once | ORAL | 0 refills | Status: AC

## 2021-01-10 MED ORDER — GABAPENTIN 100 MG PO CAPS
ORAL_CAPSULE | ORAL | 0 refills | Status: DC
Start: 2021-01-10 — End: 2021-01-30

## 2021-01-10 NOTE — Progress Notes (Signed)
Subjective:    Patient ID: Alison [name], female    DOB: 07-12-1951, 70 y.o.   MRN: [id number]  HPI  Pt is a 70 yo female with HTN who presents to the clinic to follow up after ED visit for angioedema associated with lisinopril on 01/08/2021. She had been on lisinopril for years with no problem. Her lips were starting to swell more and more and then her tongue felt big as well. She was SOB.   MDM Number of Diagnoses or Management Options Angioedema, initial encounter Diagnosis management comments: 70 year old female who presents the emergency department today for evaluation of throat swelling, shortness of breath. On exam, patient is afebrile, no acute distress, regular rhythm, lungs are clear, abdomen is benign, nonfocal neuro exam, no edema, captains or Denna Haggard' sign bilaterally, she has minimal uvula edema and perhaps minimal swelling of her lips. Differential diagnosis: Suspect mild angioedema which may be lisinopril induced, the does not appear to be infectious, regarding her shortness of breath, no clinical signs of pneumonia, do not suspect cardiac origin, PE, pneumothorax or pulmonary edema. We will check basic labs, screening EKG and chest x-ray, will give her a dose of IV Decadron and continue to monitor. She will need observation for a few hours and can likely be discharged to discontinue lisinopril.  EKG my interpretation: Sinus rhythm 83, normal axis, normal intervals, no ST elevation or ST depression, there may be some minimal nonspecific T wave findings in V1 and V2, incomplete right bundle branch block. Impression: Incomplete right bundle branch block with perhaps minimal nonspecific T wave findings in V1 and V2 that could also be normal variant. Impression: No ischemia  CBC and EKG are unremarkable. Chest x-ray negative per my read. She will need ER observation but I think she will do well as long as she stops her lisinopril and will be prepped for discharge conditionally.  She is  doing well on norvasc. No problems or concerns. Not checking BP at home.   She continues to have chronic bilateral knee pain. She does not want to take NsAIDs daily. She wonders what else there is to take for pain.   .. Active Ambulatory Problems    Diagnosis Date Noted  . HYPERTENSION, BENIGN 05/30/2008  . GERD 05/30/2008  . Alcohol abuse 11/29/2013  . Depression 11/29/2013  . 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 IFG (impaired fasting glucose) 09/09/2016  . Duodenal bulb ulcer with perforation (HCC) 02/20/2017  . Elevated LFTs 04/22/2017  . Obesity (BMI 30.0-34.9) 07/29/2016  . Dislocation of finger PIP joint 09/14/2018  . Lumbar spinal stenosis 03/16/2019  . Dyspareunia, female 07/05/2019  . Atrophic vaginitis 07/05/2019  . Post concussion syndrome 04/17/2020  . Orthostatic hypotension 04/17/2020  . Vitamin B1 deficiency 05/08/2020  . Folate deficiency 05/08/2020  . Malaise and fatigue 02/02/2020  . Hyponatremia 08/30/2019  . Hypokalemia 08/30/2019  . Combined forms of age-related cataract of right eye 06/03/2019  . Chronic diarrhea 07/01/2020  . Dizziness 09/01/2020  . Cheilitis 11/06/2020  . Alcohol use 11/06/2020  . Dry mouth 11/06/2020  . Low serum vitamin B12 12/05/2020  . Angio-edema 01/10/2021   Resolved Ambulatory Problems    Diagnosis Date Noted  . ALCOHOL USE 05/30/2008  . OSTEOARTHRITIS, KNEE 08/26/2008  . LEG PAIN, BILATERAL 06/14/2008  . CHEST PAIN, ACUTE 10/17/2008  . HTN (hypertension) 11/29/2013  . Gastroenteritis 07/29/2016  . Orthostasis 07/29/2016  . Cellulitis of right little finger 10/14/2019  . Fall 04/17/2020  .  Urinary tract infection with hematuria 02/02/2020  . Rash 02/03/2020  . Nausea vomiting and diarrhea 08/30/2019   Past Medical History:  Diagnosis Date  . Endometriosis       Review of Systems See HPI.     Objective:   Physical Exam Vitals reviewed.  Constitutional:      Appearance:  Normal appearance. She is obese.  HENT:     Mouth/Throat:     Mouth: Mucous membranes are moist.     Pharynx: No oropharyngeal exudate or posterior oropharyngeal erythema.     Comments: No lip swelling or dry cracked lips No tongue swelling Cardiovascular:     Rate and Rhythm: Normal rate and regular rhythm.     Pulses: Normal pulses.  Pulmonary:     Effort: Pulmonary effort is normal.     Breath sounds: Normal breath sounds.  Neurological:     General: No focal deficit present.     Mental Status: She is alert.  Psychiatric:        Mood and Affect: Mood normal.           Assessment & Plan:  Marland KitchenMarland KitchenMagdaline was seen today for hospitalization follow-up.  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 May increase to up to 3 tablets in the morning and evening.  HYPERTENSION, BENIGN  Acute vaginitis -     fluconazole (DIFLUCAN) 150 MG tablet; Take 1 tablet (150 mg total) by mouth once for 1 dose.   Follow up from hospital.  Added lisinopril to allergy list.  norvasc replaced lisinopril. BP to goal.  Follow up in 1-2 months with PcP for another BP recheck.   Per patient yeast from prednisone in IV. Sent diflucan. Follow up if not improving.   Low calcium. Increase calcium in diet and add supplement. Will continue to monitor. Per patient she is not drinking any alcohol right now.   Chronic knee pain- follow up with Dr. Karie Schwalbe or ortho as needed. Consider gabapentin. Sent to pharmacy. Use topical NSAID cream. Kidney function good can take NSAIDs as needed as long as no stomach issues. Alcoholic and high risk for abuse of controlled substances. Avoid opioids. May consider tramadol.   Follow up 4 weeks with PcP.

## 2021-01-15 ENCOUNTER — Encounter: Payer: Self-pay | Admitting: Physician Assistant

## 2021-01-29 DIAGNOSIS — M25561 Pain in right knee: Secondary | ICD-10-CM | POA: Diagnosis not present

## 2021-01-29 DIAGNOSIS — M170 Bilateral primary osteoarthritis of knee: Secondary | ICD-10-CM | POA: Diagnosis not present

## 2021-01-29 DIAGNOSIS — G8929 Other chronic pain: Secondary | ICD-10-CM | POA: Diagnosis not present

## 2021-01-30 ENCOUNTER — Telehealth: Payer: Self-pay | Admitting: *Deleted

## 2021-01-30 NOTE — Telephone Encounter (Signed)
Pt called asking for a print out of her prescription list to be emailed to her. She was unable to get this from her my chart.

## 2021-01-31 DIAGNOSIS — M25562 Pain in left knee: Secondary | ICD-10-CM | POA: Diagnosis not present

## 2021-01-31 DIAGNOSIS — M25561 Pain in right knee: Secondary | ICD-10-CM | POA: Diagnosis not present

## 2021-02-05 ENCOUNTER — Other Ambulatory Visit: Payer: Self-pay | Admitting: Family Medicine

## 2021-02-06 ENCOUNTER — Other Ambulatory Visit: Payer: Self-pay

## 2021-02-06 ENCOUNTER — Ambulatory Visit: Payer: Medicare Other

## 2021-02-06 DIAGNOSIS — E538 Deficiency of other specified B group vitamins: Secondary | ICD-10-CM

## 2021-02-06 NOTE — Progress Notes (Signed)
Order placed for B12 labs.

## 2021-02-13 DIAGNOSIS — Z01818 Encounter for other preprocedural examination: Secondary | ICD-10-CM | POA: Diagnosis not present

## 2021-02-13 DIAGNOSIS — M25562 Pain in left knee: Secondary | ICD-10-CM | POA: Diagnosis not present

## 2021-02-13 DIAGNOSIS — M1712 Unilateral primary osteoarthritis, left knee: Secondary | ICD-10-CM | POA: Diagnosis not present

## 2021-02-22 DIAGNOSIS — Z9049 Acquired absence of other specified parts of digestive tract: Secondary | ICD-10-CM | POA: Diagnosis not present

## 2021-02-22 DIAGNOSIS — M1712 Unilateral primary osteoarthritis, left knee: Secondary | ICD-10-CM | POA: Diagnosis not present

## 2021-02-22 DIAGNOSIS — Z96652 Presence of left artificial knee joint: Secondary | ICD-10-CM | POA: Diagnosis not present

## 2021-02-22 DIAGNOSIS — Z791 Long term (current) use of non-steroidal anti-inflammatories (NSAID): Secondary | ICD-10-CM | POA: Diagnosis not present

## 2021-02-22 DIAGNOSIS — Z20822 Contact with and (suspected) exposure to covid-19: Secondary | ICD-10-CM | POA: Diagnosis not present

## 2021-02-22 DIAGNOSIS — I10 Essential (primary) hypertension: Secondary | ICD-10-CM | POA: Diagnosis not present

## 2021-02-22 DIAGNOSIS — G8918 Other acute postprocedural pain: Secondary | ICD-10-CM | POA: Diagnosis not present

## 2021-02-22 DIAGNOSIS — M170 Bilateral primary osteoarthritis of knee: Secondary | ICD-10-CM | POA: Diagnosis not present

## 2021-02-22 DIAGNOSIS — Z885 Allergy status to narcotic agent status: Secondary | ICD-10-CM | POA: Diagnosis not present

## 2021-02-22 DIAGNOSIS — Z471 Aftercare following joint replacement surgery: Secondary | ICD-10-CM | POA: Diagnosis not present

## 2021-02-22 DIAGNOSIS — Z79899 Other long term (current) drug therapy: Secondary | ICD-10-CM | POA: Diagnosis not present

## 2021-02-22 DIAGNOSIS — K219 Gastro-esophageal reflux disease without esophagitis: Secondary | ICD-10-CM | POA: Diagnosis not present

## 2021-02-22 DIAGNOSIS — Z888 Allergy status to other drugs, medicaments and biological substances status: Secondary | ICD-10-CM | POA: Diagnosis not present

## 2021-02-22 DIAGNOSIS — K279 Peptic ulcer, site unspecified, unspecified as acute or chronic, without hemorrhage or perforation: Secondary | ICD-10-CM | POA: Diagnosis not present

## 2021-02-22 DIAGNOSIS — Z880 Allergy status to penicillin: Secondary | ICD-10-CM | POA: Diagnosis not present

## 2021-02-23 DIAGNOSIS — Z9049 Acquired absence of other specified parts of digestive tract: Secondary | ICD-10-CM | POA: Diagnosis not present

## 2021-02-23 DIAGNOSIS — M25562 Pain in left knee: Secondary | ICD-10-CM | POA: Diagnosis not present

## 2021-02-23 DIAGNOSIS — Z885 Allergy status to narcotic agent status: Secondary | ICD-10-CM | POA: Diagnosis not present

## 2021-02-23 DIAGNOSIS — Z20822 Contact with and (suspected) exposure to covid-19: Secondary | ICD-10-CM | POA: Diagnosis not present

## 2021-02-23 DIAGNOSIS — K219 Gastro-esophageal reflux disease without esophagitis: Secondary | ICD-10-CM | POA: Diagnosis not present

## 2021-02-23 DIAGNOSIS — Z791 Long term (current) use of non-steroidal anti-inflammatories (NSAID): Secondary | ICD-10-CM | POA: Diagnosis not present

## 2021-02-23 DIAGNOSIS — Z79899 Other long term (current) drug therapy: Secondary | ICD-10-CM | POA: Diagnosis not present

## 2021-02-23 DIAGNOSIS — Z888 Allergy status to other drugs, medicaments and biological substances status: Secondary | ICD-10-CM | POA: Diagnosis not present

## 2021-02-23 DIAGNOSIS — M170 Bilateral primary osteoarthritis of knee: Secondary | ICD-10-CM | POA: Diagnosis not present

## 2021-02-23 DIAGNOSIS — Z880 Allergy status to penicillin: Secondary | ICD-10-CM | POA: Diagnosis not present

## 2021-02-23 DIAGNOSIS — I10 Essential (primary) hypertension: Secondary | ICD-10-CM | POA: Diagnosis not present

## 2021-02-24 DIAGNOSIS — Z791 Long term (current) use of non-steroidal anti-inflammatories (NSAID): Secondary | ICD-10-CM | POA: Diagnosis not present

## 2021-02-24 DIAGNOSIS — Z880 Allergy status to penicillin: Secondary | ICD-10-CM | POA: Diagnosis not present

## 2021-02-24 DIAGNOSIS — I10 Essential (primary) hypertension: Secondary | ICD-10-CM | POA: Diagnosis not present

## 2021-02-24 DIAGNOSIS — Z79899 Other long term (current) drug therapy: Secondary | ICD-10-CM | POA: Diagnosis not present

## 2021-02-24 DIAGNOSIS — Z888 Allergy status to other drugs, medicaments and biological substances status: Secondary | ICD-10-CM | POA: Diagnosis not present

## 2021-02-24 DIAGNOSIS — Z9049 Acquired absence of other specified parts of digestive tract: Secondary | ICD-10-CM | POA: Diagnosis not present

## 2021-02-24 DIAGNOSIS — M170 Bilateral primary osteoarthritis of knee: Secondary | ICD-10-CM | POA: Diagnosis not present

## 2021-02-24 DIAGNOSIS — K219 Gastro-esophageal reflux disease without esophagitis: Secondary | ICD-10-CM | POA: Diagnosis not present

## 2021-02-24 DIAGNOSIS — Z885 Allergy status to narcotic agent status: Secondary | ICD-10-CM | POA: Diagnosis not present

## 2021-02-24 DIAGNOSIS — Z20822 Contact with and (suspected) exposure to covid-19: Secondary | ICD-10-CM | POA: Diagnosis not present

## 2021-02-25 DIAGNOSIS — K219 Gastro-esophageal reflux disease without esophagitis: Secondary | ICD-10-CM | POA: Diagnosis not present

## 2021-02-25 DIAGNOSIS — I10 Essential (primary) hypertension: Secondary | ICD-10-CM | POA: Diagnosis not present

## 2021-02-25 DIAGNOSIS — M170 Bilateral primary osteoarthritis of knee: Secondary | ICD-10-CM | POA: Diagnosis not present

## 2021-02-25 DIAGNOSIS — Z888 Allergy status to other drugs, medicaments and biological substances status: Secondary | ICD-10-CM | POA: Diagnosis not present

## 2021-02-25 DIAGNOSIS — Z885 Allergy status to narcotic agent status: Secondary | ICD-10-CM | POA: Diagnosis not present

## 2021-02-25 DIAGNOSIS — Z791 Long term (current) use of non-steroidal anti-inflammatories (NSAID): Secondary | ICD-10-CM | POA: Diagnosis not present

## 2021-02-25 DIAGNOSIS — Z20822 Contact with and (suspected) exposure to covid-19: Secondary | ICD-10-CM | POA: Diagnosis not present

## 2021-02-25 DIAGNOSIS — Z9049 Acquired absence of other specified parts of digestive tract: Secondary | ICD-10-CM | POA: Diagnosis not present

## 2021-02-25 DIAGNOSIS — Z880 Allergy status to penicillin: Secondary | ICD-10-CM | POA: Diagnosis not present

## 2021-02-25 DIAGNOSIS — Z79899 Other long term (current) drug therapy: Secondary | ICD-10-CM | POA: Diagnosis not present

## 2021-02-26 DIAGNOSIS — Z888 Allergy status to other drugs, medicaments and biological substances status: Secondary | ICD-10-CM | POA: Diagnosis not present

## 2021-02-26 DIAGNOSIS — K219 Gastro-esophageal reflux disease without esophagitis: Secondary | ICD-10-CM | POA: Diagnosis not present

## 2021-02-26 DIAGNOSIS — M1712 Unilateral primary osteoarthritis, left knee: Secondary | ICD-10-CM | POA: Diagnosis not present

## 2021-02-26 DIAGNOSIS — Z791 Long term (current) use of non-steroidal anti-inflammatories (NSAID): Secondary | ICD-10-CM | POA: Diagnosis not present

## 2021-02-26 DIAGNOSIS — I10 Essential (primary) hypertension: Secondary | ICD-10-CM | POA: Diagnosis not present

## 2021-02-26 DIAGNOSIS — R7301 Impaired fasting glucose: Secondary | ICD-10-CM | POA: Diagnosis not present

## 2021-02-26 DIAGNOSIS — M48061 Spinal stenosis, lumbar region without neurogenic claudication: Secondary | ICD-10-CM | POA: Diagnosis not present

## 2021-02-26 DIAGNOSIS — Z96652 Presence of left artificial knee joint: Secondary | ICD-10-CM | POA: Diagnosis not present

## 2021-02-26 DIAGNOSIS — Z79899 Other long term (current) drug therapy: Secondary | ICD-10-CM | POA: Diagnosis not present

## 2021-02-26 DIAGNOSIS — Z20822 Contact with and (suspected) exposure to covid-19: Secondary | ICD-10-CM | POA: Diagnosis not present

## 2021-02-26 DIAGNOSIS — M170 Bilateral primary osteoarthritis of knee: Secondary | ICD-10-CM | POA: Diagnosis not present

## 2021-02-26 DIAGNOSIS — Z885 Allergy status to narcotic agent status: Secondary | ICD-10-CM | POA: Diagnosis not present

## 2021-02-26 DIAGNOSIS — Z880 Allergy status to penicillin: Secondary | ICD-10-CM | POA: Diagnosis not present

## 2021-02-26 DIAGNOSIS — Z9049 Acquired absence of other specified parts of digestive tract: Secondary | ICD-10-CM | POA: Diagnosis not present

## 2021-02-26 DIAGNOSIS — M25562 Pain in left knee: Secondary | ICD-10-CM | POA: Diagnosis not present

## 2021-02-27 DIAGNOSIS — K219 Gastro-esophageal reflux disease without esophagitis: Secondary | ICD-10-CM | POA: Diagnosis not present

## 2021-02-27 DIAGNOSIS — Z96652 Presence of left artificial knee joint: Secondary | ICD-10-CM | POA: Diagnosis not present

## 2021-02-27 DIAGNOSIS — M48061 Spinal stenosis, lumbar region without neurogenic claudication: Secondary | ICD-10-CM | POA: Diagnosis not present

## 2021-02-27 DIAGNOSIS — I10 Essential (primary) hypertension: Secondary | ICD-10-CM | POA: Diagnosis not present

## 2021-02-27 DIAGNOSIS — R7301 Impaired fasting glucose: Secondary | ICD-10-CM | POA: Diagnosis not present

## 2021-02-27 DIAGNOSIS — M170 Bilateral primary osteoarthritis of knee: Secondary | ICD-10-CM | POA: Diagnosis not present

## 2021-03-02 ENCOUNTER — Other Ambulatory Visit: Payer: Self-pay | Admitting: *Deleted

## 2021-03-02 MED ORDER — FLUCONAZOLE 150 MG PO TABS: 150.0000 mg | ORAL_TABLET | Freq: Once | ORAL | 0 refills | Status: AC

## 2021-03-06 DIAGNOSIS — I10 Essential (primary) hypertension: Secondary | ICD-10-CM | POA: Diagnosis not present

## 2021-03-06 DIAGNOSIS — Z96652 Presence of left artificial knee joint: Secondary | ICD-10-CM | POA: Diagnosis not present

## 2021-03-06 DIAGNOSIS — R7301 Impaired fasting glucose: Secondary | ICD-10-CM | POA: Diagnosis not present

## 2021-03-06 DIAGNOSIS — K219 Gastro-esophageal reflux disease without esophagitis: Secondary | ICD-10-CM | POA: Diagnosis not present

## 2021-03-06 DIAGNOSIS — M170 Bilateral primary osteoarthritis of knee: Secondary | ICD-10-CM | POA: Diagnosis not present

## 2021-03-06 DIAGNOSIS — M48061 Spinal stenosis, lumbar region without neurogenic claudication: Secondary | ICD-10-CM | POA: Diagnosis not present

## 2021-03-07 ENCOUNTER — Ambulatory Visit: Payer: Medicare Other | Admitting: Family Medicine

## 2021-03-07 DIAGNOSIS — M25562 Pain in left knee: Secondary | ICD-10-CM | POA: Diagnosis not present

## 2021-03-10 DIAGNOSIS — M1711 Unilateral primary osteoarthritis, right knee: Secondary | ICD-10-CM | POA: Diagnosis not present

## 2021-03-10 DIAGNOSIS — K219 Gastro-esophageal reflux disease without esophagitis: Secondary | ICD-10-CM | POA: Diagnosis not present

## 2021-03-10 DIAGNOSIS — Z471 Aftercare following joint replacement surgery: Secondary | ICD-10-CM | POA: Diagnosis not present

## 2021-03-10 DIAGNOSIS — M48061 Spinal stenosis, lumbar region without neurogenic claudication: Secondary | ICD-10-CM | POA: Diagnosis not present

## 2021-03-10 DIAGNOSIS — Z96652 Presence of left artificial knee joint: Secondary | ICD-10-CM | POA: Diagnosis not present

## 2021-03-10 DIAGNOSIS — I10 Essential (primary) hypertension: Secondary | ICD-10-CM | POA: Diagnosis not present

## 2021-03-12 ENCOUNTER — Encounter: Payer: Self-pay | Admitting: Family Medicine

## 2021-03-12 ENCOUNTER — Ambulatory Visit (INDEPENDENT_AMBULATORY_CARE_PROVIDER_SITE_OTHER): Payer: Medicare Other | Admitting: Family Medicine

## 2021-03-12 ENCOUNTER — Other Ambulatory Visit: Payer: Self-pay

## 2021-03-12 VITALS — BP 139/38 | HR 60 | Ht 61.0 in | Wt 165.0 lb

## 2021-03-12 DIAGNOSIS — I10 Essential (primary) hypertension: Secondary | ICD-10-CM

## 2021-03-12 DIAGNOSIS — Z96652 Presence of left artificial knee joint: Secondary | ICD-10-CM

## 2021-03-12 DIAGNOSIS — E538 Deficiency of other specified B group vitamins: Secondary | ICD-10-CM

## 2021-03-12 MED ORDER — CYANOCOBALAMIN 1000 MCG/ML IJ SOLN
1000.0000 ug | Freq: Once | INTRAMUSCULAR | Status: AC
  Administered 2021-03-12: 1000 ug via INTRAMUSCULAR

## 2021-03-12 MED ORDER — AMLODIPINE BESYLATE 10 MG PO TABS: 10.0000 mg | ORAL_TABLET | Freq: Every day | ORAL | 1 refills | Status: DC

## 2021-03-12 NOTE — Assessment & Plan Note (Signed)
Getting B12 shots but has missed a few doses bc of her surgery. Due to recheck her levels from Jan.  Will give b12 once she is done.    Lab Results  Component Value Date   VITAMINB12 206 11/03/2020

## 2021-03-12 NOTE — Progress Notes (Signed)
Established Patient Office Visit  Subjective:  Patient ID: Alison [name], female    DOB: 05/30/1951  Age: 70 y.o. MRN: [id number]  CC:  Chief Complaint  Patient presents with  . Hypertension    HPI Alison [name] presents for   Hypertension- Pt denies chest pain, SOB, dizziness, or heart palpitations.  Taking meds as directed w/o problems.  Denies medication side effects.  Started 5mg  amlodipine.    Had her left knee replacement and is doing well. Starts PT tomorrow.  Wants to know if we can refill her pain medications. She says she only has one tab left.   He unfortunately had a bad experience at the rehab facility.  She felt like they really were not giving her formal PT.  She says she would not recommend it to anyone.  Her ex-husband is here with her today and drove her here today.  She is still using a walker she feels like her incision is healing well but wants to know if I will refill her oxycodone.  Past Medical History:  Diagnosis Date  . Endometriosis     Past Surgical History:  Procedure Laterality Date  . ABDOMINAL HYSTERECTOMY      Family History  Problem Relation Age of Onset  . Cancer Mother   . Hyperlipidemia Mother   . Hypertension Mother   . Alcohol abuse Father   . Cancer Other   . Heart attack Other   . Diabetes Other   . Stroke Other     Social History   Socioeconomic History  . Marital status: Single    Spouse name: Not on file  . Number of children: 1  . Years of education: 69  . Highest education level: 12th grade  Occupational History  . Occupation: 14  Tobacco Use  . Smoking status: Never Smoker  . Smokeless tobacco: Never Used  Vaping Use  . Vaping Use: Never used  Substance and Sexual Activity  . Alcohol use: Not Currently  . Drug use: No  . Sexual activity: Yes    Comment: works FT, doesn't regularly exercise  Other Topics Concern  . Not on file  Social History Narrative   Own candle in [place]   Social  Determinants of Health   Financial Resource Strain: Not on file  Food Insecurity: Not on file  Transportation Needs: Not on file  Physical Activity: Not on file  Stress: Not on file  Social Connections: Not on file  Intimate Partner Violence: Not on file    Outpatient Medications Prior to Visit  Medication Sig Dispense Refill  . esomeprazole (NEXIUM) 20 MG capsule Take by mouth.    . metoprolol succinate (TOPROL-XL) 100 MG 24 hr tablet TAKE 1 TABLET BY MOUTH ONCE DAILY .  TAKE  WITH  OR  IMMEDIATELY  FOLLOWING  A  MEAL 90 tablet 0  . PREMARIN vaginal cream INSERT 1 APPLICATORFUL VAGINALLY ONCE DAILY FOR  TWO  WEEKS  THEN  DECREASE  TO  TWICE  A  WEEK 30 g 0  . amLODipine (NORVASC) 5 MG tablet Take 1 tablet (5 mg total) by mouth daily. 30 tablet 1  . traMADol (ULTRAM) 50 MG tablet Take 50 mg by mouth 3 (three) times daily as needed.     No facility-administered medications prior to visit.    Allergies  Allergen Reactions  . Lisinopril Swelling  . Tramadol Nausea And Vomiting    Other reaction(s): Nausea    ROS Review of  Systems    Objective:    Physical Exam Constitutional:      Appearance: She is well-developed.  HENT:     Head: Normocephalic and atraumatic.  Cardiovascular:     Rate and Rhythm: Normal rate and regular rhythm.     Heart sounds: Normal heart sounds.  Pulmonary:     Effort: Pulmonary effort is normal.     Breath sounds: Normal breath sounds.  Skin:    General: Skin is warm and dry.  Neurological:     Mental Status: She is alert and oriented to person, place, and time.  Psychiatric:        Behavior: Behavior normal.     BP (!) 139/38   Pulse 60   Ht 5\' 1"  (1.549 m)   Wt 165 lb (74.8 kg)   SpO2 100%   BMI 31.18 kg/m  Wt Readings from Last 3 Encounters:  03/12/21 165 lb (74.8 kg)  01/10/21 163 lb (73.9 kg)  12/19/20 163 lb (73.9 kg)     Health Maintenance Due  Topic Date Due  . Hepatitis C Screening  Never done  . Zoster Vaccines-  Shingrix (1 of 2) Never done  . MAMMOGRAM  07/30/2013  . DEXA SCAN  Never done    There are no preventive care reminders to display for this patient.  Lab Results  Component Value Date   TSH 2.35 11/03/2020   Lab Results  Component Value Date   WBC 6.9 11/03/2020   HGB 12.4 11/03/2020   HCT 35.5 11/03/2020   MCV 107.3 (H) 11/03/2020   PLT 239 11/03/2020   Lab Results  Component Value Date   NA 136 11/03/2020   K 5.1 11/03/2020   CO2 27 11/03/2020   GLUCOSE 110 (H) 11/03/2020   BUN 18 11/03/2020   CREATININE 1.32 (H) 11/03/2020   BILITOT 0.8 11/03/2020   ALKPHOS 76 01/31/2016   AST 13 11/03/2020   ALT 7 11/03/2020   PROT 6.3 11/03/2020   ALBUMIN 3.6 01/31/2016   CALCIUM 9.4 11/03/2020   Lab Results  Component Value Date   CHOL 208 (H) 01/19/2020   Lab Results  Component Value Date   HDL 100 01/19/2020   Lab Results  Component Value Date   LDLCALC 92 01/19/2020   Lab Results  Component Value Date   TRIG 75 01/19/2020   Lab Results  Component Value Date   CHOLHDL 2.1 01/19/2020   Lab Results  Component Value Date   HGBA1C 5.4 12/05/2020      Assessment & Plan:   Problem List Items Addressed This Visit      Cardiovascular and Mediastinum   HYPERTENSION, BENIGN - Primary    Uncontrolled today.  Will increase amlodipine to 10 mg.  New prescription sent to pharmacy.  Plan to recheck in 6 months. Due for labs.       Relevant Medications   amLODipine (NORVASC) 10 MG tablet   Other Relevant Orders   CBC   COMPLETE METABOLIC PANEL WITH GFR   Lipid panel   B12     Other   Low serum vitamin B12    Getting B12 shots but has missed a few doses bc of her surgery. Due to recheck her levels from Jan.  Will give b12 once she is done.    Lab Results  Component Value Date   VITAMINB12 206 11/03/2020         Relevant Orders   B12    Other Visit Diagnoses  S/P total knee replacement, left          Status post left knee replacement-starting  physical therapy tomorrow which is fantastic.  Urged her to reach out to her orthopedist for any pain prescriptions related to her surgery.  Meds ordered this encounter  Medications  . amLODipine (NORVASC) 10 MG tablet    Sig: Take 1 tablet (10 mg total) by mouth daily.    Dispense:  90 tablet    Refill:  1    Follow-up: Return in about 6 months (around 09/11/2021) for Hypertension.    Nani Gasser, MD

## 2021-03-12 NOTE — Progress Notes (Signed)
Pt came in today for B12 injection. Injection given in RD tolerated well.  Pt reports no negative side effects from medication. Denies any dizziness, chest pain or palpitations, and no GI problems.. Pt to RTC in 30 for next injection.

## 2021-03-12 NOTE — Addendum Note (Signed)
Addended by: Deno Etienne on: 03/12/2021 04:15 PM   Modules accepted: Orders

## 2021-03-12 NOTE — Assessment & Plan Note (Addendum)
Uncontrolled today.  Will increase amlodipine to 10 mg.  New prescription sent to pharmacy.  Plan to recheck in 6 months. Due for labs.

## 2021-03-13 DIAGNOSIS — I10 Essential (primary) hypertension: Secondary | ICD-10-CM | POA: Diagnosis not present

## 2021-03-13 DIAGNOSIS — Z96652 Presence of left artificial knee joint: Secondary | ICD-10-CM | POA: Diagnosis not present

## 2021-03-13 DIAGNOSIS — Z471 Aftercare following joint replacement surgery: Secondary | ICD-10-CM | POA: Diagnosis not present

## 2021-03-13 DIAGNOSIS — M1711 Unilateral primary osteoarthritis, right knee: Secondary | ICD-10-CM | POA: Diagnosis not present

## 2021-03-13 DIAGNOSIS — M48061 Spinal stenosis, lumbar region without neurogenic claudication: Secondary | ICD-10-CM | POA: Diagnosis not present

## 2021-03-13 DIAGNOSIS — K219 Gastro-esophageal reflux disease without esophagitis: Secondary | ICD-10-CM | POA: Diagnosis not present

## 2021-03-14 DIAGNOSIS — Z471 Aftercare following joint replacement surgery: Secondary | ICD-10-CM | POA: Diagnosis not present

## 2021-03-14 DIAGNOSIS — K219 Gastro-esophageal reflux disease without esophagitis: Secondary | ICD-10-CM | POA: Diagnosis not present

## 2021-03-14 DIAGNOSIS — I10 Essential (primary) hypertension: Secondary | ICD-10-CM | POA: Diagnosis not present

## 2021-03-14 DIAGNOSIS — M1711 Unilateral primary osteoarthritis, right knee: Secondary | ICD-10-CM | POA: Diagnosis not present

## 2021-03-14 DIAGNOSIS — M48061 Spinal stenosis, lumbar region without neurogenic claudication: Secondary | ICD-10-CM | POA: Diagnosis not present

## 2021-03-14 DIAGNOSIS — Z96652 Presence of left artificial knee joint: Secondary | ICD-10-CM | POA: Diagnosis not present

## 2021-03-15 DIAGNOSIS — M1711 Unilateral primary osteoarthritis, right knee: Secondary | ICD-10-CM | POA: Diagnosis not present

## 2021-03-15 DIAGNOSIS — K219 Gastro-esophageal reflux disease without esophagitis: Secondary | ICD-10-CM | POA: Diagnosis not present

## 2021-03-15 DIAGNOSIS — Z471 Aftercare following joint replacement surgery: Secondary | ICD-10-CM | POA: Diagnosis not present

## 2021-03-15 DIAGNOSIS — I10 Essential (primary) hypertension: Secondary | ICD-10-CM | POA: Diagnosis not present

## 2021-03-15 DIAGNOSIS — Z96652 Presence of left artificial knee joint: Secondary | ICD-10-CM | POA: Diagnosis not present

## 2021-03-15 DIAGNOSIS — M48061 Spinal stenosis, lumbar region without neurogenic claudication: Secondary | ICD-10-CM | POA: Diagnosis not present

## 2021-03-16 DIAGNOSIS — I10 Essential (primary) hypertension: Secondary | ICD-10-CM | POA: Diagnosis not present

## 2021-03-16 DIAGNOSIS — Z96652 Presence of left artificial knee joint: Secondary | ICD-10-CM | POA: Diagnosis not present

## 2021-03-16 DIAGNOSIS — K219 Gastro-esophageal reflux disease without esophagitis: Secondary | ICD-10-CM | POA: Diagnosis not present

## 2021-03-16 DIAGNOSIS — M48061 Spinal stenosis, lumbar region without neurogenic claudication: Secondary | ICD-10-CM | POA: Diagnosis not present

## 2021-03-16 DIAGNOSIS — M1711 Unilateral primary osteoarthritis, right knee: Secondary | ICD-10-CM | POA: Diagnosis not present

## 2021-03-16 DIAGNOSIS — Z471 Aftercare following joint replacement surgery: Secondary | ICD-10-CM | POA: Diagnosis not present

## 2021-03-17 LAB — COMPLETE METABOLIC PANEL WITH GFR
AG Ratio: 1.8 (calc) (ref 1.0–2.5)
ALT: 8 U/L (ref 6–29)
AST: 13 U/L (ref 10–35)
Albumin: 4.1 g/dL (ref 3.6–5.1)
Alkaline phosphatase (APISO): 92 U/L (ref 37–153)
BUN: 10 mg/dL (ref 7–25)
CO2: 28 mmol/L (ref 20–32)
Calcium: 10 mg/dL (ref 8.6–10.4)
Chloride: 99 mmol/L (ref 98–110)
Creat: 0.93 mg/dL (ref 0.50–0.99)
GFR, Est African American: 73 mL/min/{1.73_m2} (ref >=60)
GFR, Est Non African American: 63 mL/min/{1.73_m2} (ref >=60)
Globulin: 2.3 g/dL (calc) (ref 1.9–3.7)
Glucose, Bld: 91 mg/dL (ref 65–99)
Potassium: 4.8 mmol/L (ref 3.5–5.3)
Sodium: 137 mmol/L (ref 135–146)
Total Bilirubin: 0.5 mg/dL (ref 0.2–1.2)
Total Protein: 6.4 g/dL (ref 6.1–8.1)

## 2021-03-17 LAB — CBC
HCT: 34 % — ABNORMAL LOW (ref 35.0–45.0)
Hemoglobin: 11.1 g/dL — ABNORMAL LOW (ref 11.7–15.5)
MCH: 33.3 pg — ABNORMAL HIGH (ref 27.0–33.0)
MCHC: 32.6 g/dL (ref 32.0–36.0)
MCV: 102.1 fL — ABNORMAL HIGH (ref 80.0–100.0)
MPV: 10.6 fL (ref 7.5–12.5)
Platelets: 349 10*3/uL (ref 140–400)
RBC: 3.33 10*6/uL — ABNORMAL LOW (ref 3.80–5.10)
RDW: 12 % (ref 11.0–15.0)
WBC: 5.5 10*3/uL (ref 3.8–10.8)

## 2021-03-17 LAB — FOLATE: Folate: 6.8 ng/mL

## 2021-03-17 LAB — LIPID PANEL
Cholesterol: 202 mg/dL — ABNORMAL HIGH (ref <200)
HDL: 56 mg/dL (ref >=50)
LDL Cholesterol (Calc): 125 mg/dL (calc) — ABNORMAL HIGH
Non-HDL Cholesterol (Calc): 146 mg/dL (calc) — ABNORMAL HIGH (ref <130)
Total CHOL/HDL Ratio: 3.6 (calc) (ref <5.0)
Triglycerides: 99 mg/dL (ref <150)

## 2021-03-17 LAB — VITAMIN B12: Vitamin B-12: 545 pg/mL (ref 200–1100)

## 2021-03-19 ENCOUNTER — Other Ambulatory Visit: Payer: Self-pay | Admitting: Family Medicine

## 2021-03-19 DIAGNOSIS — Z471 Aftercare following joint replacement surgery: Secondary | ICD-10-CM | POA: Diagnosis not present

## 2021-03-19 DIAGNOSIS — Z96652 Presence of left artificial knee joint: Secondary | ICD-10-CM | POA: Diagnosis not present

## 2021-03-19 DIAGNOSIS — I10 Essential (primary) hypertension: Secondary | ICD-10-CM | POA: Diagnosis not present

## 2021-03-19 DIAGNOSIS — M1711 Unilateral primary osteoarthritis, right knee: Secondary | ICD-10-CM | POA: Diagnosis not present

## 2021-03-19 DIAGNOSIS — K219 Gastro-esophageal reflux disease without esophagitis: Secondary | ICD-10-CM | POA: Diagnosis not present

## 2021-03-19 DIAGNOSIS — M48061 Spinal stenosis, lumbar region without neurogenic claudication: Secondary | ICD-10-CM | POA: Diagnosis not present

## 2021-03-19 DIAGNOSIS — E538 Deficiency of other specified B group vitamins: Secondary | ICD-10-CM

## 2021-03-19 MED ORDER — CYANOCOBALAMIN 1000 MCG/ML IJ SOLN
1000.0000 ug | INTRAMUSCULAR | Status: AC
  Administered 2021-04-12: 1000 ug via INTRAMUSCULAR

## 2021-03-23 DIAGNOSIS — I10 Essential (primary) hypertension: Secondary | ICD-10-CM | POA: Diagnosis not present

## 2021-03-23 DIAGNOSIS — Z96652 Presence of left artificial knee joint: Secondary | ICD-10-CM | POA: Diagnosis not present

## 2021-03-23 DIAGNOSIS — M1711 Unilateral primary osteoarthritis, right knee: Secondary | ICD-10-CM | POA: Diagnosis not present

## 2021-03-23 DIAGNOSIS — Z471 Aftercare following joint replacement surgery: Secondary | ICD-10-CM | POA: Diagnosis not present

## 2021-03-23 DIAGNOSIS — M48061 Spinal stenosis, lumbar region without neurogenic claudication: Secondary | ICD-10-CM | POA: Diagnosis not present

## 2021-03-23 DIAGNOSIS — K219 Gastro-esophageal reflux disease without esophagitis: Secondary | ICD-10-CM | POA: Diagnosis not present

## 2021-03-28 DIAGNOSIS — Z471 Aftercare following joint replacement surgery: Secondary | ICD-10-CM | POA: Diagnosis not present

## 2021-03-28 DIAGNOSIS — M1711 Unilateral primary osteoarthritis, right knee: Secondary | ICD-10-CM | POA: Diagnosis not present

## 2021-03-28 DIAGNOSIS — M48061 Spinal stenosis, lumbar region without neurogenic claudication: Secondary | ICD-10-CM | POA: Diagnosis not present

## 2021-03-28 DIAGNOSIS — K219 Gastro-esophageal reflux disease without esophagitis: Secondary | ICD-10-CM | POA: Diagnosis not present

## 2021-03-28 DIAGNOSIS — I10 Essential (primary) hypertension: Secondary | ICD-10-CM | POA: Diagnosis not present

## 2021-03-28 DIAGNOSIS — Z96652 Presence of left artificial knee joint: Secondary | ICD-10-CM | POA: Diagnosis not present

## 2021-04-02 ENCOUNTER — Telehealth: Payer: Self-pay | Admitting: Family Medicine

## 2021-04-02 NOTE — Progress Notes (Signed)
  Chronic Care Management   Note  04/02/2021 Name: Alison [name] MRN: [id number] DOB: 1950/11/30  Alison [name] is a 70 y.o. year old female who is a primary care patient of Metheney, Barbarann Ehlers, MD. I reached out to Genevive Bi by phone today in response to a referral sent by [name]'s PCP, Agapito Games, MD.   [name] including:  CCM service includes personalized support from designated clinical staff supervised by her physician, including individualized plan of care and coordination with other care providers 24/7 contact phone numbers for assistance for urgent and routine care needs. Service will only be billed when office clinical staff spend 20 minutes or more in a month to coordinate care. Only one practitioner may furnish and bill the service in a calendar month. The patient may stop CCM services at any time (effective at the end of the month) by phone call to the office staff.   Patient did not agree to enrollment in care management services and does not wish to consider at this time.  Follow up plan:   Carmell Austria [employer] Scheduler

## 2021-04-04 DIAGNOSIS — Z96652 Presence of left artificial knee joint: Secondary | ICD-10-CM | POA: Diagnosis not present

## 2021-04-04 DIAGNOSIS — M1711 Unilateral primary osteoarthritis, right knee: Secondary | ICD-10-CM | POA: Diagnosis not present

## 2021-04-04 DIAGNOSIS — Z471 Aftercare following joint replacement surgery: Secondary | ICD-10-CM | POA: Diagnosis not present

## 2021-04-04 DIAGNOSIS — I10 Essential (primary) hypertension: Secondary | ICD-10-CM | POA: Diagnosis not present

## 2021-04-04 DIAGNOSIS — K219 Gastro-esophageal reflux disease without esophagitis: Secondary | ICD-10-CM | POA: Diagnosis not present

## 2021-04-04 DIAGNOSIS — M48061 Spinal stenosis, lumbar region without neurogenic claudication: Secondary | ICD-10-CM | POA: Diagnosis not present

## 2021-04-10 ENCOUNTER — Ambulatory Visit: Payer: Medicare Other

## 2021-04-12 ENCOUNTER — Other Ambulatory Visit: Payer: Self-pay

## 2021-04-12 ENCOUNTER — Ambulatory Visit (INDEPENDENT_AMBULATORY_CARE_PROVIDER_SITE_OTHER): Payer: Medicare Other | Admitting: Family Medicine

## 2021-04-12 VITALS — BP 130/66 | HR 77 | Resp 20 | Ht 61.0 in | Wt 165.0 lb

## 2021-04-12 DIAGNOSIS — E538 Deficiency of other specified B group vitamins: Secondary | ICD-10-CM

## 2021-04-12 NOTE — Progress Notes (Signed)
Agree with documentation as above.   Raden Byington, MD  

## 2021-04-12 NOTE — Progress Notes (Signed)
Established Patient Office Visit  Subjective:  Patient ID: Alison [name], female    DOB: 24-Oct-1950  Age: 70 y.o. MRN: [id number]  CC:  Chief Complaint  Patient presents with   Low serum vitamin B12    HPI [name] presents for a B12 injection. Denies muscle cramps, weakness or irregular heart rate. Injection given in right deltoid, pt tolerated well with no apparent complications.   Past Medical History:  Diagnosis Date   Endometriosis     Past Surgical History:  Procedure Laterality Date   ABDOMINAL HYSTERECTOMY      Family History  Problem Relation Age of Onset   Cancer Mother    Hyperlipidemia Mother    Hypertension Mother    Alcohol abuse Father    Cancer Other    Heart attack Other    Diabetes Other    Stroke Other     Social History   Socioeconomic History   Marital status: Single    Spouse name: Not on file   Number of children: 1   Years of education: 16   Highest education level: 12th grade  Occupational History   Occupation: Lawyer  Tobacco Use   Smoking status: Never   Smokeless tobacco: Never  Vaping Use   Vaping Use: Never used  Substance and Sexual Activity   Alcohol use: Not Currently   Drug use: No   Sexual activity: Yes    Comment: works FT, doesn't regularly exercise  Other Topics Concern   Not on file  Social History Narrative   Own candle in [place]   Social Determinants of Health   Financial Resource Strain: Not on file  Food Insecurity: Not on file  Transportation Needs: Not on file  Physical Activity: Not on file  Stress: Not on file  Social Connections: Not on file  Intimate Partner Violence: Not on file    Outpatient Medications Prior to Visit  Medication Sig Dispense Refill   amLODipine (NORVASC) 10 MG tablet Take 1 tablet (10 mg total) by mouth daily. 90 tablet 1   esomeprazole (NEXIUM) 20 MG capsule Take by mouth.     metoprolol succinate (TOPROL-XL) 100 MG 24 hr tablet TAKE 1 TABLET BY  MOUTH ONCE DAILY .  TAKE  WITH  OR  IMMEDIATELY  FOLLOWING  A  MEAL 90 tablet 0   PREMARIN vaginal cream INSERT 1 APPLICATORFUL VAGINALLY ONCE DAILY FOR  TWO  WEEKS  THEN  DECREASE  TO  TWICE  A  WEEK 30 g 0   Facility-Administered Medications Prior to Visit  Medication Dose Route Frequency Provider Last Rate Last Admin   cyanocobalamin ((VITAMIN B-12)) injection 1,000 mcg  1,000 mcg Intramuscular Q30 days Agapito Games, MD   1,000 mcg at 04/12/21 1515    Allergies  Allergen Reactions   Lisinopril Swelling   Tramadol Nausea And Vomiting    Other reaction(s): Nausea    ROS Review of Systems    Objective:    Physical Exam  BP 130/66 (BP Location: Left Arm, Patient Position: Sitting, Cuff Size: Normal)   Pulse 77   Resp 20   Ht 5\' 1"  (1.549 m)   Wt 165 lb (74.8 kg)   SpO2 98%   BMI 31.18 kg/m  Wt Readings from Last 3 Encounters:  04/12/21 165 lb (74.8 kg)  03/12/21 165 lb (74.8 kg)  01/10/21 163 lb (73.9 kg)     Health Maintenance Due  Topic Date Due   Hepatitis C Screening  Never done   Zoster Vaccines- Shingrix (1 of 2) Never done   MAMMOGRAM  07/30/2013   DEXA SCAN  Never done    There are no preventive care reminders to display for this patient.  Lab Results  Component Value Date   TSH 2.35 11/03/2020   Lab Results  Component Value Date   WBC 5.5 03/12/2021   HGB 11.1 (L) 03/12/2021   HCT 34.0 (L) 03/12/2021   MCV 102.1 (H) 03/12/2021   PLT 349 03/12/2021   Lab Results  Component Value Date   NA 137 03/12/2021   K 4.8 03/12/2021   CO2 28 03/12/2021   GLUCOSE 91 03/12/2021   BUN 10 03/12/2021   CREATININE 0.93 03/12/2021   BILITOT 0.5 03/12/2021   ALKPHOS 76 01/31/2016   AST 13 03/12/2021   ALT 8 03/12/2021   PROT 6.4 03/12/2021   ALBUMIN 3.6 01/31/2016   CALCIUM 10.0 03/12/2021   Lab Results  Component Value Date   CHOL 202 (H) 03/12/2021   Lab Results  Component Value Date   HDL 56 03/12/2021   Lab Results  Component Value  Date   LDLCALC 125 (H) 03/12/2021   Lab Results  Component Value Date   TRIG 99 03/12/2021   Lab Results  Component Value Date   CHOLHDL 3.6 03/12/2021   Lab Results  Component Value Date   HGBA1C 5.4 12/05/2020      Assessment & Plan:  Injection given in right deltoid, pt tolerated well with no apparent complications.  Problem List Items Addressed This Visit       Other   Low serum vitamin B12 - Primary    No orders of the defined types were placed in this encounter.   Follow-up: Return in about 1 month (around 05/13/2021) for B12 Injection.    Kathrynn Speed, CMA

## 2021-04-17 DIAGNOSIS — M25562 Pain in left knee: Secondary | ICD-10-CM | POA: Diagnosis not present

## 2021-04-23 DIAGNOSIS — M25562 Pain in left knee: Secondary | ICD-10-CM | POA: Diagnosis not present

## 2021-04-23 DIAGNOSIS — R6889 Other general symptoms and signs: Secondary | ICD-10-CM | POA: Diagnosis not present

## 2021-05-08 ENCOUNTER — Ambulatory Visit: Payer: Medicare Other | Admitting: Sports Medicine

## 2021-05-09 ENCOUNTER — Ambulatory Visit (INDEPENDENT_AMBULATORY_CARE_PROVIDER_SITE_OTHER): Payer: Medicare Other | Admitting: Sports Medicine

## 2021-05-09 ENCOUNTER — Other Ambulatory Visit: Payer: Self-pay

## 2021-05-09 ENCOUNTER — Ambulatory Visit (INDEPENDENT_AMBULATORY_CARE_PROVIDER_SITE_OTHER): Payer: Medicare Other

## 2021-05-09 DIAGNOSIS — M1711 Unilateral primary osteoarthritis, right knee: Secondary | ICD-10-CM

## 2021-05-09 DIAGNOSIS — M170 Bilateral primary osteoarthritis of knee: Secondary | ICD-10-CM | POA: Diagnosis not present

## 2021-05-09 NOTE — Progress Notes (Signed)
    Procedures performed today:    Procedure: Real-time Ultrasound Guided injection of the right knee Device: Samsung HS60  Verbal informed consent obtained.  Time-out conducted.  Noted no overlying erythema, induration, or other signs of local infection.  Skin prepped in a sterile fashion.  Local anesthesia: Topical Ethyl chloride.  With sterile technique and under real time ultrasound guidance: noted trace effusion, 1 cc Kenalog 40, 2 cc lidocaine, 2 cc bupivacaine injected easily Completed without difficulty  Advised to call if fevers/chills, erythema, induration, drainage, or persistent bleeding.  Images permanently stored and available for review in PACS.  Impression: Technically successful ultrasound guided injection.  Independent interpretation of notes and tests performed by another provider:   None.  Brief History, Exam, Impression, and Recommendations:    Primary osteoarthritis of right knee, status post left knee arthroplasty This is a very pleasant 70 year old female, she has history of bilateral knee osteoarthritis post multiple injections, viscosupplementation, bilateral genicular radiofrequency ablation, she finally had a left knee arthroplasty, right knee is having some pain, injected today. Return to see me as needed.  Chronic process with exacerbation and pharmacologic intervention.    ___________________________________________ [name]. [name], M.D., ABFM., CAQSM. Primary Care and Sports Medicine [facility]  Adjunct Instructor of Family Medicine  [employer]

## 2021-05-09 NOTE — Assessment & Plan Note (Signed)
This is a very pleasant 70 year old female, she has history of bilateral knee osteoarthritis post multiple injections, viscosupplementation, bilateral genicular radiofrequency ablation, she finally had a left knee arthroplasty, right knee is having some pain, injected today. Return to see me as needed.  Chronic process with exacerbation and pharmacologic intervention.

## 2021-05-14 ENCOUNTER — Ambulatory Visit: Payer: Medicare Other

## 2021-05-15 ENCOUNTER — Encounter: Payer: Self-pay | Admitting: Medical-Surgical

## 2021-05-15 ENCOUNTER — Ambulatory Visit (INDEPENDENT_AMBULATORY_CARE_PROVIDER_SITE_OTHER): Payer: Medicare Other | Admitting: Medical-Surgical

## 2021-05-15 ENCOUNTER — Other Ambulatory Visit: Payer: Self-pay

## 2021-05-15 VITALS — BP 159/51 | HR 65

## 2021-05-15 DIAGNOSIS — E538 Deficiency of other specified B group vitamins: Secondary | ICD-10-CM

## 2021-05-15 MED ORDER — CYANOCOBALAMIN 1000 MCG/ML IJ SOLN
1000.0000 ug | Freq: Once | INTRAMUSCULAR | Status: AC
  Administered 2021-05-15: 1000 ug via INTRAMUSCULAR

## 2021-05-15 NOTE — Progress Notes (Signed)
  Pt is here for a vitamin B12 injection.  Pt denies muscle cramps, weakness or irregular heart rate.  [name], CMA

## 2021-05-18 ENCOUNTER — Telehealth: Payer: Self-pay | Admitting: *Deleted

## 2021-05-18 NOTE — Telephone Encounter (Signed)
Pt called and stated that she will need to have DMV medical clearance forms completed. She stated that she hasn't received the forms. I told her that once she gets the forms she will need to schedule an appt . She mentioned that she would have to have 2 doctors to sign off on these and I informed her that I wasn't sure about that she asked if Dr. Linford [name] could and Alison [name] could do this the same day. I told her that I wasn't sure and that her insurance may not pay for 2 visits in the same day.

## 2021-05-22 ENCOUNTER — Encounter: Payer: Self-pay | Admitting: Family Medicine

## 2021-05-22 ENCOUNTER — Telehealth: Payer: Self-pay | Admitting: *Deleted

## 2021-05-22 ENCOUNTER — Other Ambulatory Visit: Payer: Self-pay | Admitting: Family Medicine

## 2021-05-22 DIAGNOSIS — R0602 Shortness of breath: Secondary | ICD-10-CM

## 2021-05-22 NOTE — Telephone Encounter (Signed)
Ok for referral to pulmonology fo they can do the spirometry.

## 2021-05-22 NOTE — Telephone Encounter (Signed)
Referral placed.  So does she still need an appt with you? To complete any of her forms? She has been scheduled for 06/01/2021 for this.

## 2021-05-22 NOTE — Telephone Encounter (Signed)
Pt called and stated that she is going to need a cardiopulmomary doctor to fill out the portion of the [employer] medical review form and asked if we have someone here to have get this done.   I told her that we do not. I asked if she has scheduled an appt w/pcp to start having her form completed. She mentioned something about having a Spirometry done. I asked if the paperwork mentioned that this needed to be done by the pulmonologist or if it can be done by pcp. She wasn't sure.   She is going to scan her forms in via my chart. I told her that I would check to see if these could be completed by another physician to help this process move faster for her. Will fwd to pcp for any advice.

## 2021-05-24 NOTE — Telephone Encounter (Signed)
Lvm advising pt that a referral was placed for pulmonology and that she would not need an appt with Dr. Linford Arnold.

## 2021-05-25 NOTE — Telephone Encounter (Signed)
Pt called in this morning regarding referral. Gave her the name , address and phone number for [facility] pulmonology in [facility] . She will call them to try and get scheduled

## 2021-05-27 NOTE — Progress Notes (Signed)
Synopsis: Referred for dyspnea by Agapito Games, *  Subjective:   PATIENT ID: Alison [name] GENDER: female DOB: September 13, 1951, MRN: [id number]  Chief Complaint  Patient presents with   Consult     Patient states she needs breathing test  for DUI, Patient denies problems      70yF with never smoker, history of endometriosis, HTN who presents for [facility] medical review form completion, spirometry.  She says that you have to breathe and hum in order to use interlock. She does not yet have interlock installed. She was unable to hum with sufficient force to unlock interlock device.    She describes no trouble breathing. She just has trouble with coordinating blowing/humming at the same time. She has no cough, chest pain. No throat tightness or hoarseness when she's attempting the device.   Otherwise pertinent review of systems is negative.  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 Never has lived outside of [place].   She has no allergies to medications  Past Medical History:  Diagnosis Date   Endometriosis      Family History  Problem Relation Age of Onset   Cancer Mother    Hyperlipidemia Mother    Hypertension Mother    Alcohol abuse Father    Cancer Other    Heart attack Other    Diabetes Other    Stroke Other      Past Surgical History:  Procedure Laterality Date   ABDOMINAL HYSTERECTOMY      Social History   Socioeconomic History   Marital status: Single    Spouse name: Not on file   Number of children: 1   Years of education: 52   Highest education level: 12th grade  Occupational History   Occupation: Lawyer  Tobacco Use   Smoking status: Never   Smokeless tobacco: Never  Vaping Use   Vaping Use: Never used  Substance and Sexual Activity   Alcohol use: Not Currently   Drug use: No   Sexual activity: Yes    Comment: works FT, doesn't regularly exercise   Other Topics Concern   Not on file  Social History Narrative   Own candle in [place]   Social Determinants of Health   Financial Resource Strain: Not on file  Food Insecurity: Not on file  Transportation Needs: Not on file  Physical Activity: Not on file  Stress: Not on file  Social Connections: Not on file  Intimate Partner Violence: Not on file     Allergies  Allergen Reactions   Lisinopril Swelling   Tramadol Nausea And Vomiting    Other reaction(s): Nausea     Outpatient Medications Prior to Visit  Medication Sig Dispense Refill   esomeprazole (NEXIUM) 20 MG capsule Take by mouth.     metoprolol succinate (TOPROL-XL) 100 MG 24 hr tablet TAKE 1 TABLET BY MOUTH ONCE DAILY TAKE  WITH  OR  IMMEDIATELY  FOLLOWING  A  MEAL 90 tablet 0   PREMARIN vaginal cream INSERT 1 APPLICATORFUL VAGINALLY ONCE DAILY FOR  TWO  WEEKS  THEN  DECREASE  TO  TWICE  A  WEEK 30 g 0   Facility-Administered Medications Prior to Visit  Medication Dose Route Frequency Provider Last Rate Last Admin   cyanocobalamin ((VITAMIN B-12)) injection 1,000 mcg  1,000 mcg Intramuscular Q30 days Agapito Games, MD   1,000 mcg at 04/12/21 1515  Objective:   Physical Exam:  General appearance: 70 y.o., female, NAD, conversant  Eyes: anicteric sclerae, moist conjunctivae; no lid-lag; PERRL, tracking appropriately HENT: NCAT; oropharynx, MMM, no mucosal ulcerations; normal hard and soft palate Neck: Trachea midline; no lymphadenopathy, no JVD Lungs: CTAB, no crackles, no wheeze, with normal respiratory effort CV: RRR, no MRGs  Abdomen: Soft, non-tender; non-distended, BS present  Extremities: No peripheral edema, radial and DP pulses present bilaterally  Skin: Normal temperature, turgor and texture; no rash Psych: Appropriate affect Neuro: Alert and oriented to person and place, no focal deficit    Vitals:   05/28/21 1046  BP: (!) 150/70  Pulse: 67  Temp: 97.7 F (36.5 C)  TempSrc:  Oral  SpO2: 100%  Weight: 170 lb 6.4 oz (77.3 kg)  Height: 5\' 1"  (1.549 m)   100% on RA BMI Readings from Last 3 Encounters:  05/28/21 32.20 kg/m  04/12/21 31.18 kg/m  03/12/21 31.18 kg/m   Wt Readings from Last 3 Encounters:  05/28/21 170 lb 6.4 oz (77.3 kg)  04/12/21 165 lb (74.8 kg)  03/12/21 165 lb (74.8 kg)     CBC    Component Value Date/Time   WBC 5.5 03/12/2021 0000   RBC 3.33 (L) 03/12/2021 0000   HGB 11.1 (L) 03/12/2021 0000   HCT 34.0 (L) 03/12/2021 0000   PLT 349 03/12/2021 0000   MCV 102.1 (H) 03/12/2021 0000   MCH 33.3 (H) 03/12/2021 0000   MCHC 32.6 03/12/2021 0000   RDW 12.0 03/12/2021 0000   LYMPHSABS 1,235 04/17/2020 1455   MONOABS 0.2 11/16/2012 1126   EOSABS 28 04/17/2020 1455   BASOSABS 43 04/17/2020 1455      Chest Imaging: None to review  Pulmonary Functions Testing Results: No flowsheet data found.      Assessment & Plan:   # Alcohol use: requires court ordered interlock device but reportedly has trouble coordinating breathe-hum technique.  Plan: - basic spirometry - if FEV1 <1.5L then we can request lower volume interlock device - If FEV1 >=1.5 then I will consider referral to speech to help her coordinate breathe-hum technique    I spent 45 minutes dedicated to the care of this patient on the date of this encounter to include pre-visit review of records, face-to-face time with the patient discussing the DMV interlock device, calling the DMV and an interlock device company in [place] to discuss mechanism required for unlocking device, post visit ordering of testing, clinical documentation with the electronic health record, making appropriate referrals as documented, and communicating necessary findings to members of the patients care team.       [place], MD [facility] 05/28/2021 10:56 AM

## 2021-05-28 ENCOUNTER — Ambulatory Visit: Payer: Medicare Other | Admitting: Student

## 2021-05-28 ENCOUNTER — Encounter: Payer: Self-pay | Admitting: Student

## 2021-05-28 ENCOUNTER — Other Ambulatory Visit: Payer: Self-pay | Admitting: Student

## 2021-05-28 ENCOUNTER — Other Ambulatory Visit: Payer: Self-pay

## 2021-05-28 VITALS — BP 150/70 | HR 67 | Temp 97.7°F | Ht 61.0 in | Wt 170.4 lb

## 2021-05-28 DIAGNOSIS — Z7289 Other problems related to lifestyle: Secondary | ICD-10-CM

## 2021-05-28 DIAGNOSIS — Z789 Other specified health status: Secondary | ICD-10-CM

## 2021-05-28 NOTE — Patient Instructions (Signed)
-   We will schedule spirometry with next available appointment tomorrow - You will need to be tested for covid-19 with PCR swab today at [facility] - see address that Misty Stanley will pass along - I will call with results of spirometry to discuss next steps

## 2021-05-29 ENCOUNTER — Ambulatory Visit: Payer: Medicare Other

## 2021-05-29 ENCOUNTER — Telehealth: Payer: Self-pay | Admitting: Student

## 2021-05-29 LAB — SARS CORONAVIRUS 2 (TAT 6-24 HRS): SARS Coronavirus 2: POSITIVE — AB

## 2021-05-29 NOTE — Telephone Encounter (Signed)
Pt called b/c she tested postive for covid and had a PFT sched for today. Pt believes the results may be inaccurate b/c she does not have symptoms. Pt anxiously states she cannot wait 90 days for PFT b/c she needs it in order to get her license. Would like to know if there are any alternatives.

## 2021-05-29 NOTE — Telephone Encounter (Signed)
Spoke with the pt. She advised [facility] called her today and told her that she tested pos for covid (test done 05/28/21) I advised that unfortunately due to [facility] policy regarding pre procedure testing, she will have to wait at least 90 days before getting PFT done.  She became upset and stated she would need to call her lawyer about this and that she disagreed with results bc she is not having any symptoms   I called the [facility] Testing site at [phone]  Spoke with Sherrill Raring, he confirmed that the pt did indeed test pos and she was called and informed of this by Inetta Fermo 05/29/21.

## 2021-05-29 NOTE — Telephone Encounter (Signed)
Called pt and there was no answer-LMTCB °

## 2021-05-30 ENCOUNTER — Ambulatory Visit: Payer: Medicare Other

## 2021-05-31 DIAGNOSIS — R0600 Dyspnea, unspecified: Secondary | ICD-10-CM | POA: Diagnosis not present

## 2021-06-01 ENCOUNTER — Ambulatory Visit: Payer: Medicare Other | Admitting: Family Medicine

## 2021-06-15 ENCOUNTER — Telehealth: Payer: Self-pay | Admitting: Family Medicine

## 2021-06-15 NOTE — Telephone Encounter (Signed)
Left message for patient to call back and schedule Medicare Annual Wellness Visit (AWV) on  Saturday Sept 10,2022 to do by phone or video 8-12  If unable to do Saturday, please get scheduled virtually/telephone on day during the week  Last AWV  04/12/2019 Please schedule at any time with [facility] Health Advisor.      40 Minutes appointment   Any questions, please call me at [phone]

## 2021-06-28 ENCOUNTER — Ambulatory Visit: Payer: Medicare Other

## 2021-07-09 ENCOUNTER — Other Ambulatory Visit: Payer: Self-pay | Admitting: Family Medicine

## 2021-07-10 ENCOUNTER — Other Ambulatory Visit: Payer: Self-pay | Admitting: *Deleted

## 2021-07-10 MED ORDER — PREMARIN 0.625 MG/GM VA CREA
TOPICAL_CREAM | VAGINAL | 2 refills | Status: DC
Start: 2021-07-10 — End: 2021-09-11

## 2021-07-13 ENCOUNTER — Ambulatory Visit: Payer: Medicare Other

## 2021-07-17 DIAGNOSIS — R0602 Shortness of breath: Secondary | ICD-10-CM | POA: Diagnosis not present

## 2021-07-17 DIAGNOSIS — Z789 Other specified health status: Secondary | ICD-10-CM | POA: Diagnosis not present

## 2021-07-27 ENCOUNTER — Ambulatory Visit (INDEPENDENT_AMBULATORY_CARE_PROVIDER_SITE_OTHER): Payer: Medicare Other | Admitting: Family Medicine

## 2021-07-27 ENCOUNTER — Other Ambulatory Visit: Payer: Self-pay

## 2021-07-27 VITALS — BP 167/73 | HR 62

## 2021-07-27 DIAGNOSIS — E538 Deficiency of other specified B group vitamins: Secondary | ICD-10-CM

## 2021-07-27 MED ORDER — CYANOCOBALAMIN 1000 MCG/ML IJ SOLN
1000.0000 ug | Freq: Once | INTRAMUSCULAR | Status: AC
  Administered 2021-07-27: 1000 ug via INTRAMUSCULAR

## 2021-07-27 NOTE — Progress Notes (Signed)
BP reading is elevated today.  Pt wouldn't let me recheck it because she stated it will not get lower and is high every time she comes in. B12 injection given RD without complication.  Pt not due to recheck b12 levels until Dec.

## 2021-07-27 NOTE — Progress Notes (Signed)
Agree with documentation as above.   Alison Worthington, MD  

## 2021-08-22 ENCOUNTER — Other Ambulatory Visit: Payer: Self-pay | Admitting: Family Medicine

## 2021-08-29 ENCOUNTER — Telehealth: Payer: Medicare Other | Admitting: Physician Assistant

## 2021-08-29 ENCOUNTER — Telehealth: Payer: Medicare Other | Admitting: Medical-Surgical

## 2021-08-29 DIAGNOSIS — J069 Acute upper respiratory infection, unspecified: Secondary | ICD-10-CM | POA: Diagnosis not present

## 2021-08-29 DIAGNOSIS — B9689 Other specified bacterial agents as the cause of diseases classified elsewhere: Secondary | ICD-10-CM | POA: Diagnosis not present

## 2021-08-29 MED ORDER — BENZONATATE 100 MG PO CAPS: 100.0000 mg | ORAL_CAPSULE | Freq: Three times a day (TID) | ORAL | 0 refills | Status: DC | PRN

## 2021-08-29 MED ORDER — DOXYCYCLINE HYCLATE 100 MG PO TABS: 100.0000 mg | ORAL_TABLET | Freq: Two times a day (BID) | ORAL | 0 refills | Status: DC

## 2021-08-29 NOTE — Progress Notes (Signed)

## 2021-09-10 ENCOUNTER — Telehealth: Payer: Medicare Other | Admitting: Medical-Surgical

## 2021-09-11 ENCOUNTER — Ambulatory Visit (INDEPENDENT_AMBULATORY_CARE_PROVIDER_SITE_OTHER): Payer: Medicare Other | Admitting: Family Medicine

## 2021-09-11 ENCOUNTER — Other Ambulatory Visit: Payer: Self-pay

## 2021-09-11 ENCOUNTER — Encounter: Payer: Self-pay | Admitting: Family Medicine

## 2021-09-11 VITALS — BP 141/57 | HR 77 | Temp 98.2°F | Resp 16 | Ht 61.0 in | Wt 173.0 lb

## 2021-09-11 DIAGNOSIS — N760 Acute vaginitis: Secondary | ICD-10-CM | POA: Diagnosis not present

## 2021-09-11 DIAGNOSIS — I10 Essential (primary) hypertension: Secondary | ICD-10-CM

## 2021-09-11 DIAGNOSIS — H8112 Benign paroxysmal vertigo, left ear: Secondary | ICD-10-CM | POA: Diagnosis not present

## 2021-09-11 MED ORDER — FLUCONAZOLE 150 MG PO TABS: 150.0000 mg | ORAL_TABLET | Freq: Every day | ORAL | 1 refills | Status: AC

## 2021-09-11 NOTE — Progress Notes (Signed)
Established Patient Office Visit  Subjective:  Patient ID: Alison [name], female    DOB: December 04, 1950  Age: 70 y.o. MRN: [id number]  CC:  Chief Complaint  Patient presents with   Hypertension    Follow up    Dizziness    2 days    HPI Alison [name] presents for   Hypertension- Pt denies chest pain, SOB, dizziness, or heart palpitations.  Taking meds as directed w/o problems.  Denies medication side effects.    She reports that she has been dizzy for 2 days.  She says it just feels more like she is off balance.  She will have to sit down and rest and then she will feel little better.  She recently had an upper respiratory infection but says she is actually feeling a lot better.  No ear pain or pressure  Also recently did an E-visit for a upper respiratory infection on November 23 and was given antibiotics.  She says she is starting to notice some vaginal itching and feels like she is probably getting a yeast infection she started to get some vaginal itching over the last couple of days.  Past Medical History:  Diagnosis Date   Endometriosis     Past Surgical History:  Procedure Laterality Date   ABDOMINAL HYSTERECTOMY      Family History  Problem Relation Age of Onset   Cancer Mother    Hyperlipidemia Mother    Hypertension Mother    Alcohol abuse Father    Cancer Other    Heart attack Other    Diabetes Other    Stroke Other     Social History   Socioeconomic History   Marital status: Single    Spouse name: Not on file   Number of children: 1   Years of education: 76   Highest education level: 12th grade  Occupational History   Occupation: Biomedical engineer  Tobacco Use   Smoking status: Never   Smokeless tobacco: Never  Vaping Use   Vaping Use: Never used  Substance and Sexual Activity   Alcohol use: Not Currently   Drug use: No   Sexual activity: Yes    Comment: works FT, doesn't regularly exercise  Other Topics Concern   Not on file  Social History  Narrative   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 Determinants of Health   Financial Resource Strain: Not on file  Food Insecurity: Not on file  Transportation Needs: Not on file  Physical Activity: Not on file  Stress: Not on file  Social Connections: Not on file  Intimate Partner Violence: Not on file    Outpatient Medications Prior to Visit  Medication Sig Dispense Refill   esomeprazole (NEXIUM) 20 MG capsule Take by mouth.     metoprolol succinate (TOPROL-XL) 100 MG 24 hr tablet TAKE 1 TABLET BY MOUTH ONCE DAILY TAKE WITH OR IMMEDIATELY FOLLOWING A MEAL 90 tablet 0   conjugated estrogens (PREMARIN) vaginal cream Place 1 applicatorful vaginally twice a week. 60 g 2   benzonatate (TESSALON) 100 MG capsule Take 1 capsule (100 mg total) by mouth 3 (three) times daily as needed. 30 capsule 0   doxycycline (VIBRA-TABS) 100 MG tablet Take 1 tablet (100 mg total) by mouth 2 (two) times daily. 14 tablet 0   Facility-Administered Medications Prior to Visit  Medication Dose Route Frequency Provider Last Rate Last Admin   cyanocobalamin ((VITAMIN B-12)) injection 1,000 mcg  1,000 mcg Intramuscular Q30 days Hali Marry,  MD   1,000 mcg at 04/12/21 1515    Allergies  Allergen Reactions   Lisinopril Swelling   Tramadol Nausea And Vomiting    Other reaction(s): Nausea    ROS Review of Systems    Objective:    Physical Exam Constitutional:      Appearance: She is well-developed.  HENT:     Head: Normocephalic and atraumatic.     Right Ear: Tympanic membrane, ear canal and external ear normal.     Left Ear: Tympanic membrane, ear canal and external ear normal.     Nose: Nose normal.     Mouth/Throat:     Mouth: Mucous membranes are moist.     Pharynx: Oropharynx is clear. No oropharyngeal exudate or posterior oropharyngeal erythema.  Eyes:     Conjunctiva/sclera: Conjunctivae normal.     Pupils: Pupils are equal, round, and reactive to light.  Neck:     Thyroid: No  thyromegaly.  Cardiovascular:     Rate and Rhythm: Normal rate and regular rhythm.     Heart sounds: Normal heart sounds.  Pulmonary:     Effort: Pulmonary effort is normal.     Breath sounds: Normal breath sounds. No wheezing.  Musculoskeletal:     Cervical back: Neck supple.  Lymphadenopathy:     Cervical: No cervical adenopathy.  Skin:    General: Skin is warm and dry.  Neurological:     Mental Status: She is alert and oriented to person, place, and time.     Comments: Hallpike maneuver performed.  She did not experience any nystagmus.  But it did recreate her vertigo sensation when she sat up from rotating her head to the left.    BP (!) 141/57 (BP Location: Left Arm)   Pulse 77   Temp 98.2 F (36.8 C)   Resp 16   Ht 5\' 1"  (1.549 m)   Wt 173 lb (78.5 kg)   SpO2 100%   BMI 32.69 kg/m  Wt Readings from Last 3 Encounters:  09/11/21 173 lb (78.5 kg)  05/28/21 170 lb 6.4 oz (77.3 kg)  04/12/21 165 lb (74.8 kg)     Health Maintenance Due  Topic Date Due   Hepatitis C Screening  Never done    There are no preventive care reminders to display for this patient.  Lab Results  Component Value Date   TSH 2.35 11/03/2020   Lab Results  Component Value Date   WBC 5.5 03/12/2021   HGB 11.1 (L) 03/12/2021   HCT 34.0 (L) 03/12/2021   MCV 102.1 (H) 03/12/2021   PLT 349 03/12/2021   Lab Results  Component Value Date   NA 137 03/12/2021   K 4.8 03/12/2021   CO2 28 03/12/2021   GLUCOSE 91 03/12/2021   BUN 10 03/12/2021   CREATININE 0.93 03/12/2021   BILITOT 0.5 03/12/2021   ALKPHOS 76 01/31/2016   AST 13 03/12/2021   ALT 8 03/12/2021   PROT 6.4 03/12/2021   ALBUMIN 3.6 01/31/2016   CALCIUM 10.0 03/12/2021   Lab Results  Component Value Date   CHOL 202 (H) 03/12/2021   Lab Results  Component Value Date   HDL 56 03/12/2021   Lab Results  Component Value Date   LDLCALC 125 (H) 03/12/2021   Lab Results  Component Value Date   TRIG 99 03/12/2021   Lab  Results  Component Value Date   CHOLHDL 3.6 03/12/2021   Lab Results  Component Value Date   HGBA1C 5.4  12/05/2020      Assessment & Plan:   Problem List Items Addressed This Visit       Cardiovascular and Mediastinum   HYPERTENSION, BENIGN - Primary    Blood pressure is up a little bit today and she says she is taking 2 medications but we do not have a second 1 on her med list I suspect that it may be valsartan but she thought maybe it was Bystolic so I really want her to call us back to let us know what she is actually taking so that we can make some changes.  I really do not want her taking 2 beta-blockers.  So once we clarify what she is using then we may be able to adjust the dose and see if that is more helpful.  She is due for BMP today so she will have that drawn.      Relevant Orders   BASIC METABOLIC PANEL WITH GFR   Other Visit Diagnoses     BPPV (benign paroxysmal positional vertigo), left       Acute vaginitis          Acute vaginitis-we will treat with Diflucan.  If not improving then recommend return for wet prep.  Did have recent antibiotics.  Positional vertigo-left-did not witness nystagmus on exam but it did recreate her symptoms to the left side.  Given handout to do exercises on her own at home over the next several days if not improving or resolving then please let me know.  Meds ordered this encounter  Medications   fluconazole (DIFLUCAN) 150 MG tablet    Sig: Take 1 tablet (150 mg total) by mouth daily.    Dispense:  1 tablet    Refill:  1    Follow-up: Return in about 6 months (around 03/12/2022) for Hypertension.    Nani Gasser, MD

## 2021-09-11 NOTE — Assessment & Plan Note (Signed)
Blood pressure is up a little bit today and she says she is taking 2 medications but we do not have a second 1 on her med list I suspect that it may be valsartan but she thought maybe it was Bystolic so I really want her to call us back to let us know what she is actually taking so that we can make some changes.  I really do not want her taking 2 beta-blockers.  So once we clarify what she is using then we may be able to adjust the dose and see if that is more helpful.  She is due for BMP today so she will have that drawn.

## 2021-09-11 NOTE — Progress Notes (Signed)
Patient states she is taking another BP medication but is unsure of the name and dose.

## 2021-09-11 NOTE — Patient Instructions (Signed)
Please call us back with your BP medications so we can make sure we have the 2nd pill on your medication list.

## 2021-09-14 ENCOUNTER — Telehealth: Payer: Self-pay

## 2021-09-14 MED ORDER — HYDROCHLOROTHIAZIDE 12.5 MG PO TABS: 12.5000 mg | ORAL_TABLET | Freq: Every day | ORAL | 0 refills | Status: AC

## 2021-09-14 NOTE — Telephone Encounter (Signed)
Okay, sounds good.  Please update her medication list.  Also see if she would be okay with [place] sending a low-dose diuretic to take daily to help control her blood pressure better.  Also help with swelling if she is okay with that we can send in hydrochlorothiazide 12.5 mg daily #90 with no refills.

## 2021-09-14 NOTE — Telephone Encounter (Signed)
Patient called the office to provide information on her BP medications that she is taking. Patient stated she is taking Metoprolol 100 mg once a day and Amlodipine 10 mg once a day. Forward to Dr. Linford Arnold for review.

## 2021-09-14 NOTE — Telephone Encounter (Signed)
Patient advised of message and verbalized understanding. Medication list updated. Patient agreed to starting Amlodipine 10 mg daily. Prescription sent per Dr. Linford Arnold.

## 2021-09-18 ENCOUNTER — Telehealth: Payer: Self-pay

## 2021-09-18 DIAGNOSIS — I10 Essential (primary) hypertension: Secondary | ICD-10-CM | POA: Diagnosis not present

## 2021-09-18 NOTE — Telephone Encounter (Signed)
Patient called stating since she has started the HCTZ she has been lightheaded and nausea. Patient stated she has had problems in the past with diuretics and is concerned about continuing to take the HCTZ. Forward to Dr. Linford Arnold for review.

## 2021-09-18 NOTE — Telephone Encounter (Signed)
Patient advised of message and verbalized understanding. Patient stated she does not want to keep starting and stopping medication. Patient stated she will give the HCTZ more time and will continue to take the HCTZ at this time.

## 2021-09-18 NOTE — Telephone Encounter (Signed)
Okay to stop the medication.  She still having dizziness and I would like to refer her for vestibular rehab.  Has she started the amlodipine 10 mg?  If she has then we can definitely stop the HCTZ 12.5 mg.

## 2021-09-19 LAB — BASIC METABOLIC PANEL WITH GFR
BUN/Creatinine Ratio: 5 (calc) — ABNORMAL LOW (ref 6–22)
BUN: 4 mg/dL — ABNORMAL LOW (ref 7–25)
CO2: 30 mmol/L (ref 20–32)
Calcium: 9.2 mg/dL (ref 8.6–10.4)
Chloride: 91 mmol/L — ABNORMAL LOW (ref 98–110)
Creat: 0.76 mg/dL (ref 0.50–1.05)
Glucose, Bld: 100 mg/dL (ref 65–139)
Potassium: 3.9 mmol/L (ref 3.5–5.3)
Sodium: 131 mmol/L — ABNORMAL LOW (ref 135–146)
eGFR: 85 mL/min/{1.73_m2} (ref >=60)

## 2021-09-19 NOTE — Progress Notes (Signed)
Is call patient: See much fluid she is drinking per day.  She actually looks a little over hydrated.  Is affecting her sodium and chloride level

## 2021-09-21 NOTE — Progress Notes (Signed)
Okay, I would like her drinking at least 40 ounces but no more than 80 ounces.  Since her sodium was low I would like for her to increase her sodium intake I know it can bump blood pressure little and we will try to get her blood pressure down but I also want her sodium level to be normal.  We can try cutting the metoprolol in half if she wants to see if that could be making her feel little dizzy if she notices improvement then let me know if not then she can go back up to a whole tab and then try cutting the amlodipine in half instead.

## 2021-10-06 DIAGNOSIS — K5900 Constipation, unspecified: Secondary | ICD-10-CM | POA: Diagnosis not present

## 2021-10-06 DIAGNOSIS — Z7952 Long term (current) use of systemic steroids: Secondary | ICD-10-CM | POA: Diagnosis not present

## 2021-10-06 DIAGNOSIS — F1090 Alcohol use, unspecified, uncomplicated: Secondary | ICD-10-CM | POA: Diagnosis not present

## 2021-10-06 DIAGNOSIS — R6889 Other general symptoms and signs: Secondary | ICD-10-CM | POA: Diagnosis not present

## 2021-10-06 DIAGNOSIS — M25552 Pain in left hip: Secondary | ICD-10-CM | POA: Diagnosis not present

## 2021-10-06 DIAGNOSIS — D62 Acute posthemorrhagic anemia: Secondary | ICD-10-CM | POA: Diagnosis not present

## 2021-10-06 DIAGNOSIS — Z791 Long term (current) use of non-steroidal anti-inflammatories (NSAID): Secondary | ICD-10-CM | POA: Diagnosis not present

## 2021-10-06 DIAGNOSIS — Z789 Other specified health status: Secondary | ICD-10-CM | POA: Diagnosis not present

## 2021-10-06 DIAGNOSIS — M170 Bilateral primary osteoarthritis of knee: Secondary | ICD-10-CM | POA: Diagnosis not present

## 2021-10-06 DIAGNOSIS — S72002A Fracture of unspecified part of neck of left femur, initial encounter for closed fracture: Secondary | ICD-10-CM | POA: Diagnosis not present

## 2021-10-06 DIAGNOSIS — W19XXXA Unspecified fall, initial encounter: Secondary | ICD-10-CM | POA: Diagnosis not present

## 2021-10-06 DIAGNOSIS — M48061 Spinal stenosis, lumbar region without neurogenic claudication: Secondary | ICD-10-CM | POA: Diagnosis not present

## 2021-10-06 DIAGNOSIS — M1990 Unspecified osteoarthritis, unspecified site: Secondary | ICD-10-CM | POA: Diagnosis not present

## 2021-10-06 DIAGNOSIS — S72142A Displaced intertrochanteric fracture of left femur, initial encounter for closed fracture: Secondary | ICD-10-CM | POA: Diagnosis not present

## 2021-10-06 DIAGNOSIS — Z4789 Encounter for other orthopedic aftercare: Secondary | ICD-10-CM | POA: Diagnosis not present

## 2021-10-06 DIAGNOSIS — H25811 Combined forms of age-related cataract, right eye: Secondary | ICD-10-CM | POA: Diagnosis not present

## 2021-10-06 DIAGNOSIS — R52 Pain, unspecified: Secondary | ICD-10-CM | POA: Diagnosis not present

## 2021-10-06 DIAGNOSIS — S199XXA Unspecified injury of neck, initial encounter: Secondary | ICD-10-CM | POA: Diagnosis not present

## 2021-10-06 DIAGNOSIS — S72002D Fracture of unspecified part of neck of left femur, subsequent encounter for closed fracture with routine healing: Secondary | ICD-10-CM | POA: Diagnosis not present

## 2021-10-06 DIAGNOSIS — Z9181 History of falling: Secondary | ICD-10-CM | POA: Diagnosis not present

## 2021-10-06 DIAGNOSIS — F32A Depression, unspecified: Secondary | ICD-10-CM | POA: Diagnosis not present

## 2021-10-06 DIAGNOSIS — R531 Weakness: Secondary | ICD-10-CM | POA: Diagnosis not present

## 2021-10-06 DIAGNOSIS — Z880 Allergy status to penicillin: Secondary | ICD-10-CM | POA: Diagnosis not present

## 2021-10-06 DIAGNOSIS — R296 Repeated falls: Secondary | ICD-10-CM | POA: Diagnosis not present

## 2021-10-06 DIAGNOSIS — Z79899 Other long term (current) drug therapy: Secondary | ICD-10-CM | POA: Diagnosis not present

## 2021-10-06 DIAGNOSIS — K219 Gastro-esophageal reflux disease without esophagitis: Secondary | ICD-10-CM | POA: Diagnosis not present

## 2021-10-06 DIAGNOSIS — R110 Nausea: Secondary | ICD-10-CM | POA: Diagnosis not present

## 2021-10-06 DIAGNOSIS — R262 Difficulty in walking, not elsewhere classified: Secondary | ICD-10-CM | POA: Diagnosis not present

## 2021-10-06 DIAGNOSIS — Z885 Allergy status to narcotic agent status: Secondary | ICD-10-CM | POA: Diagnosis not present

## 2021-10-06 DIAGNOSIS — K279 Peptic ulcer, site unspecified, unspecified as acute or chronic, without hemorrhage or perforation: Secondary | ICD-10-CM | POA: Diagnosis not present

## 2021-10-06 DIAGNOSIS — H25812 Combined forms of age-related cataract, left eye: Secondary | ICD-10-CM | POA: Diagnosis not present

## 2021-10-06 DIAGNOSIS — Z743 Need for continuous supervision: Secondary | ICD-10-CM | POA: Diagnosis not present

## 2021-10-06 DIAGNOSIS — I10 Essential (primary) hypertension: Secondary | ICD-10-CM | POA: Diagnosis not present

## 2021-10-06 DIAGNOSIS — Z888 Allergy status to other drugs, medicaments and biological substances status: Secondary | ICD-10-CM | POA: Diagnosis not present

## 2021-10-06 DIAGNOSIS — S0990XA Unspecified injury of head, initial encounter: Secondary | ICD-10-CM | POA: Diagnosis not present

## 2021-10-06 DIAGNOSIS — Z7901 Long term (current) use of anticoagulants: Secondary | ICD-10-CM | POA: Diagnosis not present

## 2021-10-06 DIAGNOSIS — M6281 Muscle weakness (generalized): Secondary | ICD-10-CM | POA: Diagnosis not present

## 2021-10-07 DIAGNOSIS — K219 Gastro-esophageal reflux disease without esophagitis: Secondary | ICD-10-CM | POA: Diagnosis not present

## 2021-10-07 DIAGNOSIS — D62 Acute posthemorrhagic anemia: Secondary | ICD-10-CM | POA: Diagnosis not present

## 2021-10-07 DIAGNOSIS — M25552 Pain in left hip: Secondary | ICD-10-CM | POA: Diagnosis not present

## 2021-10-07 DIAGNOSIS — S72142A Displaced intertrochanteric fracture of left femur, initial encounter for closed fracture: Secondary | ICD-10-CM | POA: Diagnosis not present

## 2021-10-07 DIAGNOSIS — F32A Depression, unspecified: Secondary | ICD-10-CM | POA: Diagnosis not present

## 2021-10-07 DIAGNOSIS — I10 Essential (primary) hypertension: Secondary | ICD-10-CM | POA: Diagnosis not present

## 2021-10-07 DIAGNOSIS — S72002A Fracture of unspecified part of neck of left femur, initial encounter for closed fracture: Secondary | ICD-10-CM | POA: Diagnosis not present

## 2021-10-08 DIAGNOSIS — S72002A Fracture of unspecified part of neck of left femur, initial encounter for closed fracture: Secondary | ICD-10-CM | POA: Diagnosis not present

## 2021-10-08 DIAGNOSIS — D62 Acute posthemorrhagic anemia: Secondary | ICD-10-CM | POA: Diagnosis not present

## 2021-10-08 DIAGNOSIS — I10 Essential (primary) hypertension: Secondary | ICD-10-CM | POA: Diagnosis not present

## 2021-10-09 DIAGNOSIS — D62 Acute posthemorrhagic anemia: Secondary | ICD-10-CM | POA: Diagnosis not present

## 2021-10-09 DIAGNOSIS — I10 Essential (primary) hypertension: Secondary | ICD-10-CM | POA: Diagnosis not present

## 2021-10-09 DIAGNOSIS — S72002A Fracture of unspecified part of neck of left femur, initial encounter for closed fracture: Secondary | ICD-10-CM | POA: Diagnosis not present

## 2021-10-10 DIAGNOSIS — I10 Essential (primary) hypertension: Secondary | ICD-10-CM | POA: Diagnosis not present

## 2021-10-10 DIAGNOSIS — S72002A Fracture of unspecified part of neck of left femur, initial encounter for closed fracture: Secondary | ICD-10-CM | POA: Diagnosis not present

## 2021-10-10 DIAGNOSIS — D62 Acute posthemorrhagic anemia: Secondary | ICD-10-CM | POA: Diagnosis not present

## 2021-10-11 DIAGNOSIS — D62 Acute posthemorrhagic anemia: Secondary | ICD-10-CM | POA: Diagnosis not present

## 2021-10-11 DIAGNOSIS — I10 Essential (primary) hypertension: Secondary | ICD-10-CM | POA: Diagnosis not present

## 2021-10-11 DIAGNOSIS — R110 Nausea: Secondary | ICD-10-CM | POA: Diagnosis not present

## 2021-10-11 DIAGNOSIS — R52 Pain, unspecified: Secondary | ICD-10-CM | POA: Diagnosis not present

## 2021-10-11 DIAGNOSIS — R531 Weakness: Secondary | ICD-10-CM | POA: Diagnosis not present

## 2021-10-11 DIAGNOSIS — R262 Difficulty in walking, not elsewhere classified: Secondary | ICD-10-CM | POA: Diagnosis not present

## 2021-10-11 DIAGNOSIS — M1990 Unspecified osteoarthritis, unspecified site: Secondary | ICD-10-CM | POA: Diagnosis not present

## 2021-10-11 DIAGNOSIS — S72002D Fracture of unspecified part of neck of left femur, subsequent encounter for closed fracture with routine healing: Secondary | ICD-10-CM | POA: Diagnosis not present

## 2021-10-11 DIAGNOSIS — Z743 Need for continuous supervision: Secondary | ICD-10-CM | POA: Diagnosis not present

## 2021-10-11 DIAGNOSIS — K5900 Constipation, unspecified: Secondary | ICD-10-CM | POA: Diagnosis not present

## 2021-10-11 DIAGNOSIS — H25811 Combined forms of age-related cataract, right eye: Secondary | ICD-10-CM | POA: Diagnosis not present

## 2021-10-11 DIAGNOSIS — S72002A Fracture of unspecified part of neck of left femur, initial encounter for closed fracture: Secondary | ICD-10-CM | POA: Diagnosis not present

## 2021-10-11 DIAGNOSIS — S72142D Displaced intertrochanteric fracture of left femur, subsequent encounter for closed fracture with routine healing: Secondary | ICD-10-CM | POA: Diagnosis not present

## 2021-10-11 DIAGNOSIS — Z789 Other specified health status: Secondary | ICD-10-CM | POA: Diagnosis not present

## 2021-10-11 DIAGNOSIS — M25559 Pain in unspecified hip: Secondary | ICD-10-CM | POA: Diagnosis not present

## 2021-10-11 DIAGNOSIS — H25812 Combined forms of age-related cataract, left eye: Secondary | ICD-10-CM | POA: Diagnosis not present

## 2021-10-11 DIAGNOSIS — Z9181 History of falling: Secondary | ICD-10-CM | POA: Diagnosis not present

## 2021-10-11 DIAGNOSIS — K5901 Slow transit constipation: Secondary | ICD-10-CM | POA: Diagnosis not present

## 2021-10-11 DIAGNOSIS — R7301 Impaired fasting glucose: Secondary | ICD-10-CM | POA: Diagnosis not present

## 2021-10-11 DIAGNOSIS — M170 Bilateral primary osteoarthritis of knee: Secondary | ICD-10-CM | POA: Diagnosis not present

## 2021-10-11 DIAGNOSIS — R6889 Other general symptoms and signs: Secondary | ICD-10-CM | POA: Diagnosis not present

## 2021-10-11 DIAGNOSIS — Z4789 Encounter for other orthopedic aftercare: Secondary | ICD-10-CM | POA: Diagnosis not present

## 2021-10-11 DIAGNOSIS — M48061 Spinal stenosis, lumbar region without neurogenic claudication: Secondary | ICD-10-CM | POA: Diagnosis not present

## 2021-10-11 DIAGNOSIS — W19XXXA Unspecified fall, initial encounter: Secondary | ICD-10-CM | POA: Diagnosis not present

## 2021-10-11 DIAGNOSIS — K219 Gastro-esophageal reflux disease without esophagitis: Secondary | ICD-10-CM | POA: Diagnosis not present

## 2021-10-11 DIAGNOSIS — M6281 Muscle weakness (generalized): Secondary | ICD-10-CM | POA: Diagnosis not present

## 2021-10-19 DIAGNOSIS — M25559 Pain in unspecified hip: Secondary | ICD-10-CM | POA: Diagnosis not present

## 2021-10-24 ENCOUNTER — Telehealth: Payer: Self-pay | Admitting: Family Medicine

## 2021-10-24 DIAGNOSIS — R7301 Impaired fasting glucose: Secondary | ICD-10-CM | POA: Diagnosis not present

## 2021-10-24 DIAGNOSIS — K5901 Slow transit constipation: Secondary | ICD-10-CM | POA: Diagnosis not present

## 2021-10-24 DIAGNOSIS — M1990 Unspecified osteoarthritis, unspecified site: Secondary | ICD-10-CM | POA: Diagnosis not present

## 2021-10-24 DIAGNOSIS — I10 Essential (primary) hypertension: Secondary | ICD-10-CM | POA: Diagnosis not present

## 2021-10-24 DIAGNOSIS — K219 Gastro-esophageal reflux disease without esophagitis: Secondary | ICD-10-CM | POA: Diagnosis not present

## 2021-10-24 DIAGNOSIS — S72142D Displaced intertrochanteric fracture of left femur, subsequent encounter for closed fracture with routine healing: Secondary | ICD-10-CM | POA: Diagnosis not present

## 2021-10-24 DIAGNOSIS — M48061 Spinal stenosis, lumbar region without neurogenic claudication: Secondary | ICD-10-CM | POA: Diagnosis not present

## 2021-10-24 NOTE — Telephone Encounter (Signed)
It should really be the surgeon that signed off on those.  I think she had hip replacement which is why she is in the skilled nursing facility.  So all orders should go to her surgeon who I believe is Dr. Robinette Haines.

## 2021-10-24 NOTE — Telephone Encounter (Signed)
Dr. Linford Arnold   I received an e mail from [place] at [place] stating patient is at [facility] and has a planned discharge date of 1/24 she is asking if you will be able to write patients home health orders after discharge. - CF

## 2021-10-25 NOTE — Telephone Encounter (Signed)
LVM for Alison [name] to call to discuss.  [name], CMA

## 2021-10-25 NOTE — Telephone Encounter (Signed)
Alison [name] with [facility] informed.  She expressed understanding.  [name], CMA

## 2021-11-04 DIAGNOSIS — Z9889 Other specified postprocedural states: Secondary | ICD-10-CM | POA: Diagnosis not present

## 2021-11-04 DIAGNOSIS — I10 Essential (primary) hypertension: Secondary | ICD-10-CM | POA: Diagnosis not present

## 2021-11-04 DIAGNOSIS — M25552 Pain in left hip: Secondary | ICD-10-CM | POA: Diagnosis not present

## 2021-11-04 DIAGNOSIS — R7989 Other specified abnormal findings of blood chemistry: Secondary | ICD-10-CM | POA: Diagnosis not present

## 2021-11-04 DIAGNOSIS — Z79899 Other long term (current) drug therapy: Secondary | ICD-10-CM | POA: Diagnosis not present

## 2021-11-04 DIAGNOSIS — Z9660 Presence of unspecified orthopedic joint implant: Secondary | ICD-10-CM | POA: Diagnosis not present

## 2021-11-04 DIAGNOSIS — K219 Gastro-esophageal reflux disease without esophagitis: Secondary | ICD-10-CM | POA: Diagnosis not present

## 2021-11-04 DIAGNOSIS — Z880 Allergy status to penicillin: Secondary | ICD-10-CM | POA: Diagnosis not present

## 2021-11-04 DIAGNOSIS — S72142A Displaced intertrochanteric fracture of left femur, initial encounter for closed fracture: Secondary | ICD-10-CM | POA: Diagnosis not present

## 2021-11-04 DIAGNOSIS — M79605 Pain in left leg: Secondary | ICD-10-CM | POA: Diagnosis not present

## 2021-11-04 DIAGNOSIS — Z885 Allergy status to narcotic agent status: Secondary | ICD-10-CM | POA: Diagnosis not present

## 2021-11-04 DIAGNOSIS — Z7901 Long term (current) use of anticoagulants: Secondary | ICD-10-CM | POA: Diagnosis not present

## 2021-11-04 DIAGNOSIS — Z888 Allergy status to other drugs, medicaments and biological substances status: Secondary | ICD-10-CM | POA: Diagnosis not present

## 2021-11-06 DIAGNOSIS — Z9181 History of falling: Secondary | ICD-10-CM | POA: Diagnosis not present

## 2021-11-06 DIAGNOSIS — S72142D Displaced intertrochanteric fracture of left femur, subsequent encounter for closed fracture with routine healing: Secondary | ICD-10-CM | POA: Diagnosis not present

## 2021-11-06 DIAGNOSIS — K219 Gastro-esophageal reflux disease without esophagitis: Secondary | ICD-10-CM | POA: Diagnosis not present

## 2021-11-06 DIAGNOSIS — M48061 Spinal stenosis, lumbar region without neurogenic claudication: Secondary | ICD-10-CM | POA: Diagnosis not present

## 2021-11-06 DIAGNOSIS — I10 Essential (primary) hypertension: Secondary | ICD-10-CM | POA: Diagnosis not present

## 2021-11-06 DIAGNOSIS — F32A Depression, unspecified: Secondary | ICD-10-CM | POA: Diagnosis not present

## 2021-11-08 ENCOUNTER — Telehealth: Payer: Self-pay

## 2021-11-08 NOTE — Telephone Encounter (Signed)
Patient left a vm msg requesting if provider can send in a rx for anxiety. Per patient, she recently had hip surgery 4 weeks ago and has been experiencing anxiety since the surgery was completed. She mentioned that it really is beginning to affect her. Please advise, thanks.

## 2021-11-08 NOTE — Telephone Encounter (Signed)
Nneds an appt. Of for virtual.

## 2021-11-08 NOTE — Telephone Encounter (Signed)
Left message for patient to call back to schedule an appointment.  °

## 2021-11-09 DIAGNOSIS — Z9181 History of falling: Secondary | ICD-10-CM | POA: Diagnosis not present

## 2021-11-09 DIAGNOSIS — F32A Depression, unspecified: Secondary | ICD-10-CM | POA: Diagnosis not present

## 2021-11-09 DIAGNOSIS — M48061 Spinal stenosis, lumbar region without neurogenic claudication: Secondary | ICD-10-CM | POA: Diagnosis not present

## 2021-11-09 DIAGNOSIS — I10 Essential (primary) hypertension: Secondary | ICD-10-CM | POA: Diagnosis not present

## 2021-11-09 DIAGNOSIS — S72142D Displaced intertrochanteric fracture of left femur, subsequent encounter for closed fracture with routine healing: Secondary | ICD-10-CM | POA: Diagnosis not present

## 2021-11-09 DIAGNOSIS — K219 Gastro-esophageal reflux disease without esophagitis: Secondary | ICD-10-CM | POA: Diagnosis not present

## 2021-11-13 DIAGNOSIS — S72142D Displaced intertrochanteric fracture of left femur, subsequent encounter for closed fracture with routine healing: Secondary | ICD-10-CM | POA: Diagnosis not present

## 2021-11-13 DIAGNOSIS — Z9181 History of falling: Secondary | ICD-10-CM | POA: Diagnosis not present

## 2021-11-13 DIAGNOSIS — I10 Essential (primary) hypertension: Secondary | ICD-10-CM | POA: Diagnosis not present

## 2021-11-13 DIAGNOSIS — F32A Depression, unspecified: Secondary | ICD-10-CM | POA: Diagnosis not present

## 2021-11-13 DIAGNOSIS — K219 Gastro-esophageal reflux disease without esophagitis: Secondary | ICD-10-CM | POA: Diagnosis not present

## 2021-11-13 DIAGNOSIS — M48061 Spinal stenosis, lumbar region without neurogenic claudication: Secondary | ICD-10-CM | POA: Diagnosis not present

## 2021-11-14 DIAGNOSIS — I469 Cardiac arrest, cause unspecified: Secondary | ICD-10-CM | POA: Diagnosis not present

## 2021-11-14 DIAGNOSIS — I499 Cardiac arrhythmia, unspecified: Secondary | ICD-10-CM | POA: Diagnosis not present

## 2021-11-14 DIAGNOSIS — I4901 Ventricular fibrillation: Secondary | ICD-10-CM | POA: Diagnosis not present

## 2021-11-14 DIAGNOSIS — Z743 Need for continuous supervision: Secondary | ICD-10-CM | POA: Diagnosis not present

## 2021-11-14 DIAGNOSIS — R404 Transient alteration of awareness: Secondary | ICD-10-CM | POA: Diagnosis not present

## 2021-12-05 DEATH — deceased

## 2022-03-12 ENCOUNTER — Ambulatory Visit: Payer: Medicare Other | Admitting: Family Medicine
